# Patient Record
Sex: Male | Born: 1990 | Race: Black or African American | Hispanic: No | Marital: Single | Smoking: Never smoker
Health system: Southern US, Community
[De-identification: ages and names within clinical notes are randomized; demographics above are authoritative.]

## PROBLEM LIST (undated history)

## (undated) DIAGNOSIS — Q240 Dextrocardia: Secondary | ICD-10-CM

## (undated) DIAGNOSIS — Q248 Other specified congenital malformations of heart: Secondary | ICD-10-CM

## (undated) DIAGNOSIS — I10 Essential (primary) hypertension: Secondary | ICD-10-CM

## (undated) DIAGNOSIS — E119 Type 2 diabetes mellitus without complications: Secondary | ICD-10-CM

## (undated) HISTORY — DX: Type 2 diabetes mellitus without complications: E11.9

## (undated) HISTORY — DX: Essential (primary) hypertension: I10

## (undated) HISTORY — PX: HAND SURGERY: SHX662

---

## 2011-09-06 ENCOUNTER — Encounter (HOSPITAL_COMMUNITY): Payer: Self-pay | Admitting: Oncology

## 2011-09-06 ENCOUNTER — Emergency Department (HOSPITAL_COMMUNITY): Payer: Self-pay

## 2011-09-06 ENCOUNTER — Emergency Department (HOSPITAL_COMMUNITY)
Admission: EM | Admit: 2011-09-06 | Discharge: 2011-09-06 | Disposition: A | Discharge status: Home / Self Care | Payer: Self-pay | Attending: Emergency Medicine | Admitting: Emergency Medicine

## 2011-09-06 DIAGNOSIS — R0602 Shortness of breath: Secondary | ICD-10-CM | POA: Insufficient documentation

## 2011-09-06 DIAGNOSIS — R079 Chest pain, unspecified: Secondary | ICD-10-CM | POA: Insufficient documentation

## 2011-09-06 DIAGNOSIS — R209 Unspecified disturbances of skin sensation: Secondary | ICD-10-CM | POA: Insufficient documentation

## 2011-09-06 DIAGNOSIS — R200 Anesthesia of skin: Secondary | ICD-10-CM

## 2011-09-06 HISTORY — DX: Other specified congenital malformations of heart: Q24.8

## 2011-09-06 HISTORY — DX: Dextrocardia: Q24.0

## 2011-09-06 LAB — CBC
HCT: 46.9 % (ref 39.0–52.0)
Hemoglobin: 15.9 g/dL (ref 13.0–17.0)
MCH: 29.4 pg (ref 26.0–34.0)
MCHC: 33.9 g/dL (ref 30.0–36.0)

## 2011-09-06 LAB — BASIC METABOLIC PANEL
BUN: 9 mg/dL (ref 6–23)
Chloride: 104 mEq/L (ref 96–112)
Creatinine, Ser: 1.31 mg/dL (ref 0.50–1.35)
GFR calc Af Amer: 89 mL/min — ABNORMAL LOW (ref >90)
Glucose, Bld: 98 mg/dL (ref 70–99)

## 2011-09-06 LAB — DIFFERENTIAL
Basophils Relative: 0 % (ref 0–1)
Eosinophils Absolute: 0.3 10*3/uL (ref 0.0–0.7)
Lymphs Abs: 3.7 10*3/uL (ref 0.7–4.0)
Monocytes Absolute: 0.9 10*3/uL (ref 0.1–1.0)
Monocytes Relative: 13 % — ABNORMAL HIGH (ref 3–12)

## 2011-09-06 NOTE — ED Provider Notes (Signed)
History   This chart was scribed for Donnetta Hutching, MD by Brooks Sailors. The patient was seen in room APA10/APA10. Patient's care was started at 1013.   CSN: [id number]  Arrival date & time 09/06/11  1013   First MD Initiated Contact with Patient 09/06/11 1045      Chief Complaint  Patient presents with  . Chest Pain    (Consider location/radiation/quality/duration/timing/severity/associated sxs/prior treatment) HPI  Andrew Ritter is a 21 y.o. male who presents to the Emergency Department complaining of moderate chest pain onset yesterday and persistent since with associate left side body numbness. Patient was playing basketball when chest pain started. Wife of patient notes he had confusion and weakness during episode of pain, but says patient currently looks completely normal. Patient notes a similar previous discomfort when running. Denies ever having a CT of the head. Father with h/o MI at age 59.    Past Medical History  Diagnosis Date  . Right sided heart     History reviewed. No pertinent past surgical history.  No family history on file.  History  Substance Use Topics  . Smoking status: Never Smoker   . Smokeless tobacco: Not on file  . Alcohol Use: No      Review of Systems  All other systems reviewed and are negative.    Allergies  Review of patient's allergies indicates no known allergies.  Home Medications  No current outpatient prescriptions on file.  BP 116/65  Pulse 84  Temp(Src) 97.8 F (36.6 C) (Oral)  Resp 16  Ht 5\' 6"  (1.676 m)  Wt 239 lb (108.41 kg)  BMI 38.58 kg/m2  SpO2 100%  Physical Exam  Nursing note and vitals reviewed. Constitutional: He is oriented to person, place, and time. He appears well-developed and well-nourished.  HENT:  Head: Normocephalic and atraumatic.  Eyes: Conjunctivae and EOM are normal. Pupils are equal, round, and reactive to light.  Neck: Normal range of motion. Neck supple.  Cardiovascular: Normal rate,  regular rhythm and normal heart sounds.  Exam reveals no gallop and no friction rub.   No murmur heard. Pulmonary/Chest: Effort normal and breath sounds normal. No respiratory distress. He has no wheezes. He has no rales.  Abdominal: Soft. Bowel sounds are normal.  Musculoskeletal: Normal range of motion.  Neurological: He is alert and oriented to person, place, and time.  Skin: Skin is warm and dry.  Psychiatric: He has a normal mood and affect.    ED Course  Procedures (including critical care time) DIAGNOSTIC STUDIES: Oxygen Saturation is 100% on room air, normal by my interpretation.    COORDINATION OF CARE: 10:55AM EKG blood work, chest x-ray ordered.    Labs Reviewed  DIFFERENTIAL - Abnormal; Notable for the following:    Neutrophils Relative 35 (*)    Lymphocytes Relative 49 (*)    Monocytes Relative 13 (*)    All other components within normal limits  BASIC METABOLIC PANEL - Abnormal; Notable for the following:    GFR calc non Af Amer 77 (*)    GFR calc Af Amer 89 (*)    All other components within normal limits  CBC  TROPONIN I   Ct Head Wo Contrast  09/06/2011  *RADIOLOGY REPORT*  Clinical Data: Left-sided weakness.  Shortness of breath.  Chest pain.  CT HEAD WITHOUT CONTRAST  Technique:  Contiguous axial images were obtained from the base of the skull through the vertex without contrast.  Comparison: None.  Findings: The brain has a  normal appearance without evidence of malformation, atrophy, old or acute infarction, mass lesion, hemorrhage, hydrocephalus or extra-axial collection.  There may be a small insignificant arachnoid cyst in the right frontal region, not of any clinical relevance.  The calvarium is unremarkable. Sinuses, middle ears and mastoids are clear except for a retention cyst or mucocele in the right maxillary ethmoid junction region.  IMPRESSION: Normal appearance of the brain itself.  No acute or significant finding.  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  Original Report Authenticated By: Thomasenia Sales, M.D.     No diagnosis found.   Date: 09/06/2011  Rate: 94  Rhythm: normal sinus rhythm  QRS Axis: right  Intervals: QT prolonged  ST/T Wave abnormalities: normal  Conduction Disutrbances:none  Narrative Interpretation:   Old EKG Reviewed: none available   MDM  Patient low risk for ACS, pulmonary embolus, stroke..  Normal physical exam in ED. Screening tests negative.       I personally performed the services described in this documentation, which was scribed in my presence. The recorded information has been reviewed and considered.  Donnetta Hutching, MD 09/06/11 [phone]

## 2011-09-06 NOTE — Discharge Instructions (Signed)
Tests were normal.  Need to get a primary care Dr.  Avoid vigorous exercise until cleared by primary physician

## 2011-09-06 NOTE — ED Notes (Signed)
Patient returned from xray at this time.

## 2011-09-06 NOTE — ED Notes (Signed)
MD at bedside. 

## 2011-09-06 NOTE — ED Notes (Signed)
Andrew Ritter states a near syncopal episode x yesterday - reports cp, left arm pain, generalized numbness.

## 2013-04-02 ENCOUNTER — Encounter (HOSPITAL_COMMUNITY): Payer: Self-pay | Admitting: Emergency Medicine

## 2013-04-02 ENCOUNTER — Emergency Department (HOSPITAL_COMMUNITY)
Admission: EM | Admit: 2013-04-02 | Discharge: 2013-04-02 | Disposition: A | Discharge status: Home / Self Care | Payer: Self-pay | Attending: Emergency Medicine | Admitting: Emergency Medicine

## 2013-04-02 ENCOUNTER — Emergency Department (HOSPITAL_COMMUNITY): Payer: Self-pay

## 2013-04-02 DIAGNOSIS — J209 Acute bronchitis, unspecified: Secondary | ICD-10-CM | POA: Insufficient documentation

## 2013-04-02 DIAGNOSIS — J40 Bronchitis, not specified as acute or chronic: Secondary | ICD-10-CM

## 2013-04-02 DIAGNOSIS — Z8679 Personal history of other diseases of the circulatory system: Secondary | ICD-10-CM | POA: Insufficient documentation

## 2013-04-02 MED ORDER — AZITHROMYCIN 250 MG PO TABS
500.0000 mg | ORAL_TABLET | Freq: Once | ORAL | Status: AC
  Administered 2013-04-02: 500 mg via ORAL
  Filled 2013-04-02: qty 2

## 2013-04-02 MED ORDER — GUAIFENESIN-CODEINE 100-10 MG/5ML PO SYRP: 10.0000 mL | ORAL_SOLUTION | Freq: Three times a day (TID) | ORAL | Status: DC | PRN

## 2013-04-02 MED ORDER — AZITHROMYCIN 250 MG PO TABS: ORAL_TABLET | ORAL | Status: DC

## 2013-04-02 MED ORDER — ALBUTEROL SULFATE HFA 108 (90 BASE) MCG/ACT IN AERS
2.0000 | INHALATION_SPRAY | Freq: Once | RESPIRATORY_TRACT | Status: AC
  Administered 2013-04-02: 2 via RESPIRATORY_TRACT
  Filled 2013-04-02: qty 6.7

## 2013-04-02 NOTE — ED Notes (Signed)
Cough, yellow sputum with streaks of blood.  Chest hurts with coughing.

## 2013-04-04 NOTE — ED Provider Notes (Signed)
CSN: [id number]     Arrival date & time 04/02/13  5621 History   First MD Initiated Contact with Patient 04/02/13 2055     Chief Complaint  Patient presents with  . Cough   (Consider location/radiation/quality/duration/timing/severity/associated sxs/prior Treatment) Patient is a 22 y.o. male presenting with cough. The history is provided by the patient.  Cough Cough characteristics:  Productive Sputum characteristics:  Yellow (blood tinged) Severity:  Moderate Onset quality:  Gradual Duration:  2 days Timing:  Intermittent Progression:  Unchanged Chronicity:  New Smoker: no   Context: upper respiratory infection   Context: not sick contacts, not smoke exposure and not with activity   Relieved by:  Nothing Worsened by:  Nothing tried Ineffective treatments: benadryl. Associated symptoms: rhinorrhea   Associated symptoms: no chest pain, no chills, no diaphoresis, no ear pain, no fever, no headaches, no myalgias, no rash, no shortness of breath, no sinus congestion, no sore throat and no wheezing   Associated symptoms comment:  Chest tightness with coughing.     Past Medical History  Diagnosis Date  . Right sided heart    History reviewed. No pertinent past surgical history. History reviewed. No pertinent family history. History  Substance Use Topics  . Smoking status: Never Smoker   . Smokeless tobacco: Not on file  . Alcohol Use: No    Review of Systems  Constitutional: Negative for fever, chills, diaphoresis, activity change and appetite change.  HENT: Positive for congestion and rhinorrhea. Negative for ear pain, facial swelling, sore throat and trouble swallowing.   Eyes: Negative for visual disturbance.  Respiratory: Positive for cough and chest tightness. Negative for shortness of breath, wheezing and stridor.   Cardiovascular: Negative for chest pain and palpitations.  Gastrointestinal: Negative for nausea, vomiting and abdominal pain.  Musculoskeletal: Negative  for myalgias, neck pain and neck stiffness.  Skin: Negative.  Negative for rash.  Neurological: Negative for dizziness, weakness, numbness and headaches.  Hematological: Negative for adenopathy.  Psychiatric/Behavioral: Negative for confusion.  All other systems reviewed and are negative.    Allergies  Review of patient's allergies indicates no known allergies.  Home Medications   Current Outpatient Rx  Name  Route  Sig  Dispense  Refill  . diphenhydrAMINE (BENADRYL) 25 MG tablet   Oral   Take 25-50 mg by mouth every 6 (six) hours as needed for allergies or sleep.         Marland Kitchen azithromycin (ZITHROMAX Z-PAK) 250 MG tablet      Take two tablets on day one, then one tab qd days 2-5   6 tablet   0   . guaiFENesin-codeine (ROBITUSSIN AC) 100-10 MG/5ML syrup   Oral   Take 10 mLs by mouth 3 (three) times daily as needed for cough.   120 mL   0    BP 125/70  Pulse 105  Temp(Src) 98.7 F (37.1 C) (Oral)  Resp 18  Ht 5\' 6"  (1.676 m)  Wt 255 lb (115.667 kg)  BMI 41.18 kg/m2  SpO2 98% Physical Exam  Nursing note and vitals reviewed. Constitutional: He is oriented to person, place, and time. He appears well-developed and well-nourished. No distress.  HENT:  Head: Normocephalic and atraumatic.  Right Ear: Tympanic membrane and ear canal normal.  Left Ear: Tympanic membrane and ear canal normal.  Nose: Mucosal edema present.  Mouth/Throat: Uvula is midline, oropharynx is clear and moist and mucous membranes are normal. No oropharyngeal exudate.  Eyes: EOM are normal. Pupils are equal, round,  and reactive to light.  Neck: Normal range of motion, full passive range of motion without pain and phonation normal. Neck supple.  Cardiovascular: Normal rate, regular rhythm, normal heart sounds and intact distal pulses.   No murmur heard. Pulmonary/Chest: Effort normal. No stridor. No respiratory distress. He has no rales. He exhibits no tenderness.  Coarse lungs sounds bilaterally.  No  rales or wheezing  Abdominal: He exhibits no distension. There is no tenderness. There is no rebound.  Musculoskeletal: Normal range of motion. He exhibits no edema.  Lymphadenopathy:    He has no cervical adenopathy.  Neurological: He is alert and oriented to person, place, and time. He exhibits normal muscle tone. Coordination normal.  Skin: Skin is warm and dry.    ED Course  Procedures (including critical care time) Labs Review Labs Reviewed - No data to display Imaging Review Dg Chest 2 View  04/02/2013   CLINICAL DATA:  Cough for the past 2 weeks. Intermittent hemoptysis. Situs inversus.  EXAM: CHEST  2 VIEW  COMPARISON:  Report dated 09/05/2004.  FINDINGS: Again demonstrated is dextro cardia. It is difficult to determine if the aortic arch is on the right or the left. Based on the position of the trachea, it is most likely on the right. The heart is normal in size. Clear lungs with normal vascularity. Gastric air bubble on the right. Unremarkable bones.  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   Electronically Signed   By: Gordan Payment M.D.   On: 04/02/2013 21:03    EKG Interpretation   None       MDM   1. Bronchitis    VSS.  No concerning sx's for PE.  Likely bronchitis.  Pt agrees to symptomatic tx with anti-tussive and z-pack.  He appears stable for discharge.    Sailor Haughn L. Trisha Mangle, PA-C 04/04/13 1312

## 2013-04-05 NOTE — ED Provider Notes (Signed)
Medical screening examination/treatment/procedure(s) were performed by non-physician practitioner and as supervising physician I was immediately available for consultation/collaboration.  EKG Interpretation   None       Mackson Botz, MD, FACEP   Ashlon Lottman L Bayler Nehring, MD 04/05/13 1504 

## 2014-05-28 ENCOUNTER — Emergency Department (HOSPITAL_COMMUNITY): Payer: Self-pay

## 2014-05-28 ENCOUNTER — Emergency Department (HOSPITAL_COMMUNITY)
Admission: EM | Admit: 2014-05-28 | Discharge: 2014-05-28 | Disposition: A | Discharge status: Home / Self Care | Payer: Self-pay | Attending: Emergency Medicine | Admitting: Emergency Medicine

## 2014-05-28 ENCOUNTER — Encounter (HOSPITAL_COMMUNITY): Payer: Self-pay

## 2014-05-28 DIAGNOSIS — R29898 Other symptoms and signs involving the musculoskeletal system: Secondary | ICD-10-CM

## 2014-05-28 DIAGNOSIS — Q240 Dextrocardia: Secondary | ICD-10-CM | POA: Insufficient documentation

## 2014-05-28 DIAGNOSIS — M6281 Muscle weakness (generalized): Secondary | ICD-10-CM | POA: Insufficient documentation

## 2014-05-28 DIAGNOSIS — R0600 Dyspnea, unspecified: Secondary | ICD-10-CM | POA: Insufficient documentation

## 2014-05-28 LAB — BASIC METABOLIC PANEL
ANION GAP: 6 (ref 5–15)
BUN: 13 mg/dL (ref 6–23)
CHLORIDE: 108 meq/L (ref 96–112)
CO2: 25 mmol/L (ref 19–32)
Calcium: 9 mg/dL (ref 8.4–10.5)
Creatinine, Ser: 1.4 mg/dL — ABNORMAL HIGH (ref 0.50–1.35)
GFR, EST AFRICAN AMERICAN: 80 mL/min — AB (ref >90)
GFR, EST NON AFRICAN AMERICAN: 69 mL/min — AB (ref >90)
Glucose, Bld: 99 mg/dL (ref 70–99)
POTASSIUM: 4.1 mmol/L (ref 3.5–5.1)
SODIUM: 139 mmol/L (ref 135–145)

## 2014-05-28 LAB — CBC WITH DIFFERENTIAL/PLATELET
BASOS PCT: 0 % (ref 0–1)
Basophils Absolute: 0 10*3/uL (ref 0.0–0.1)
EOS ABS: 0.2 10*3/uL (ref 0.0–0.7)
Eosinophils Relative: 2 % (ref 0–5)
HCT: 49.3 % (ref 39.0–52.0)
HEMOGLOBIN: 16.5 g/dL (ref 13.0–17.0)
Lymphocytes Relative: 21 % (ref 12–46)
Lymphs Abs: 1.8 10*3/uL (ref 0.7–4.0)
MCH: 29.2 pg (ref 26.0–34.0)
MCHC: 33.5 g/dL (ref 30.0–36.0)
MCV: 87.3 fL (ref 78.0–100.0)
MONO ABS: 0.7 10*3/uL (ref 0.1–1.0)
MONOS PCT: 8 % (ref 3–12)
Neutro Abs: 5.7 10*3/uL (ref 1.7–7.7)
Neutrophils Relative %: 69 % (ref 43–77)
PLATELETS: 200 10*3/uL (ref 150–400)
RBC: 5.65 MIL/uL (ref 4.22–5.81)
RDW: 14.1 % (ref 11.5–15.5)
WBC: 8.4 10*3/uL (ref 4.0–10.5)

## 2014-05-28 LAB — TROPONIN I: Troponin I: <0.03 ng/mL (ref <0.031)

## 2014-05-28 NOTE — Care Management Note (Signed)
ED/CM noted patient did not have health insurance and/or PCP listed in the computer.  Patient was given the [place] resource handout with information on the clinics, food pantries, and the handout for new health insurance sign-up. Pt was also given a Rx discount card. Patient expressed appreciation for information received. 

## 2014-05-28 NOTE — ED Provider Notes (Signed)
CSN: [id number]     Arrival date & time 05/28/14  0714 History  This chart was scribed for Donnetta HutchingBrian Zakhari Fogel, MD by Tonye RoyaltyJoshua Chen, ED Scribe. This patient was seen in room APA06/APA06 and the patient's care was started at 7:43 AM.    Chief Complaint  Patient presents with  . Shortness of Breath   The history is provided by the patient. No language interpreter was used.    HPI Comments: Andrew Ritter is a 24 y.o. male who presents to the Emergency Department complaining of SOB upon waking this morning. He states his SOB is resolved at this time and he feels that he is breathing at baseline. He also reports that he felt like he was going to pass out while sitting down on the toilet. He reports sharp, right central chest pain with onset a few days ago. He notes both his hands were trembling when he tried to have a drink of water. He denies illness in the past few weeks. He notes that when he runs, his left numb becomes numb. He denies asthma or other significant medical history and states he does not use medications every day. He denies drinking or smoking. He states he does not work.   No PCP, per patient  Past Medical History  Diagnosis Date  . Right sided heart    History reviewed. No pertinent past surgical history. No family history on file. History  Substance Use Topics  . Smoking status: Never Smoker   . Smokeless tobacco: Not on file  . Alcohol Use: No    Review of Systems A complete 10 system review of systems was obtained and all systems are negative except as noted in the HPI and PMH.    Allergies  Review of patient's allergies indicates no known allergies.  Home Medications   Prior to Admission medications   Medication Sig Start Date End Date Taking? Authorizing Provider  azithromycin (ZITHROMAX Z-PAK) 250 MG tablet Take two tablets on day one, then one tab qd days 2-5 Patient not taking: Reported on 05/28/2014 04/02/13   Tammy L. Triplett, PA-C  guaiFENesin-codeine (ROBITUSSIN  AC) 100-10 MG/5ML syrup Take 10 mLs by mouth 3 (three) times daily as needed for cough. Patient not taking: Reported on 05/28/2014 04/02/13   Tammy L. Triplett, PA-C   BP 137/96 mmHg  Pulse 82  Temp(Src) 98.4 F (36.9 C) (Oral)  Resp 22  Ht 5\' 5"  (1.651 m)  Wt 270 lb (122.471 kg)  BMI 44.93 kg/m2  SpO2 99% Physical Exam  Constitutional: He is oriented to person, place, and time. He appears well-developed and well-nourished.  HENT:  Head: Normocephalic and atraumatic.  Eyes: Conjunctivae and EOM are normal. Pupils are equal, round, and reactive to light.  Neck: Normal range of motion. Neck supple.  Cardiovascular: Normal rate and regular rhythm.   Pulmonary/Chest: Effort normal and breath sounds normal.  Abdominal: Soft. Bowel sounds are normal.  Musculoskeletal: Normal range of motion.  Neurological: He is alert and oriented to person, place, and time.  Skin: Skin is warm and dry.  Psychiatric: He has a normal mood and affect. His behavior is normal.  Nursing note and vitals reviewed.   ED Course  Procedures (including critical care time)  DIAGNOSTIC STUDIES: Oxygen Saturation is 100% on room air, normal by my interpretation.    COORDINATION OF CARE: 7:47 AM Discussed treatment plan with patient at beside, including labs and head CT. The patient agrees with the plan and has no further  questions at this time.   Labs Review Labs Reviewed  BASIC METABOLIC PANEL - Abnormal; Notable for the following:    Creatinine, Ser 1.40 (*)    GFR calc non Af Amer 69 (*)    GFR calc Af Amer 80 (*)    All other components within normal limits  CBC WITH DIFFERENTIAL  TROPONIN I    Imaging Review Dg Chest 2 View  05/28/2014   CLINICAL DATA:  Shortness of breath, bilateral arm weakness  EXAM: CHEST  2 VIEW  COMPARISON:  04/02/2013  FINDINGS: Situs inversus again noted. Cardiomediastinal silhouette is stable. No acute infiltrate or pulmonary edema. Bony thorax is stable.  IMPRESSION: No  active disease.  Situs inversus again noted.   Electronically Signed   By: Natasha Mead M.D.   On: 05/28/2014 08:27   Ct Head Wo Contrast  05/28/2014   CLINICAL DATA:  Left arm weakness.  Near syncope.  EXAM: CT HEAD WITHOUT CONTRAST  TECHNIQUE: Contiguous axial images were obtained from the base of the skull through the vertex without intravenous contrast.  COMPARISON:  CT head 09/06/2011  FINDINGS: Ventricle size is normal. Negative for acute or chronic infarction. Negative for hemorrhage or fluid collection. Negative for mass or edema. No shift of the midline structures.  Calvarium is intact.  IMPRESSION: Normal   Electronically Signed   By: Marlan Palau M.D.   On: 05/28/2014 08:34     EKG Interpretation   Date/Time:  Tuesday May 28 2014 07:27:00 EST Ventricular Rate:  83 PR Interval:  172 QRS Duration: 95 QT Interval:  348 QTC Calculation: 409 R Axis:   127 Text Interpretation:  Sinus or ectopic atrial rhythm Right axis deviation  Nonspecific T abnormalities, lateral leads Baseline wander in lead(s) V1  Confirmed by Brynnlie Unterreiner  MD, Kyle Stansell ([id number]) on 05/28/2014 8:14:33 AM      MDM   Final diagnoses:  Dyspnea  Left arm weakness    Patient appears in no acute distress. His low risk for ACS, PE, CVA.   Screening tests including labs, troponin, EKG, CT head, chest x-ray all negative. Acute.  Creatinine slightly elevated. Discussed with patient.  I personally performed the services described in this documentation, which was scribed in my presence. The recorded information has been reviewed and is accurate.    Donnetta Hutching, MD 05/28/14 [phone]

## 2014-05-28 NOTE — ED Notes (Signed)
Water given per request.

## 2014-05-28 NOTE — ED Notes (Signed)
EMS reports pt c/o sob that started around 0600 this morning.  Denies any cough or fever. Reports his daughter has been sick and he was up with her all night.  Denies any pain at this time.  Reports intermittent chest pain over the past few days.

## 2014-05-28 NOTE — Discharge Instructions (Signed)
You have a small elevation of your kidney test. Otherwise test were normal. Return if worse or follow-up at your primary care doctor

## 2014-05-28 NOTE — ED Notes (Signed)
Patient with no complaints at this time. Respirations even and unlabored. Skin warm/dry. Discharge instructions reviewed with patient at this time. Patient given opportunity to voice concerns/ask questions. Patient discharged at this time and left Emergency Department with steady gait.   

## 2016-07-27 ENCOUNTER — Emergency Department (HOSPITAL_COMMUNITY): Payer: Self-pay

## 2016-07-27 ENCOUNTER — Encounter (HOSPITAL_COMMUNITY): Payer: Self-pay | Admitting: *Deleted

## 2016-07-27 ENCOUNTER — Emergency Department (HOSPITAL_COMMUNITY)
Admission: EM | Admit: 2016-07-27 | Discharge: 2016-07-27 | Disposition: A | Discharge status: Home / Self Care | Payer: Self-pay | Attending: Emergency Medicine | Admitting: Emergency Medicine

## 2016-07-27 DIAGNOSIS — Z79899 Other long term (current) drug therapy: Secondary | ICD-10-CM | POA: Insufficient documentation

## 2016-07-27 DIAGNOSIS — J40 Bronchitis, not specified as acute or chronic: Secondary | ICD-10-CM | POA: Insufficient documentation

## 2016-07-27 MED ORDER — PREDNISONE 20 MG PO TABS: 40.0000 mg | ORAL_TABLET | Freq: Every day | ORAL | 0 refills | Status: DC

## 2016-07-27 MED ORDER — ALBUTEROL SULFATE HFA 108 (90 BASE) MCG/ACT IN AERS
2.0000 | INHALATION_SPRAY | Freq: Once | RESPIRATORY_TRACT | Status: AC
  Administered 2016-07-27: 2 via RESPIRATORY_TRACT
  Filled 2016-07-27: qty 6.7

## 2016-07-27 MED ORDER — BENZONATATE 100 MG PO CAPS: 200.0000 mg | ORAL_CAPSULE | Freq: Three times a day (TID) | ORAL | 0 refills | Status: DC | PRN

## 2016-07-27 NOTE — ED Triage Notes (Signed)
Pt comes in with cough, nasal congestion, fevers starting 1 week ago. Pt states his his sputum is green in color and he had bright red blood in it one time. NAD noted.

## 2016-07-27 NOTE — ED Notes (Signed)
Fever, cough-productive of phlegm with blood at times, not able to sleep due to cough, emesis but denies for past few days.  Symptoms for a week per pt.   mucinex yesterday, denies taking anything today

## 2016-07-27 NOTE — ED Provider Notes (Signed)
[facility]-EMERGENCY DEPT Provider Note   CSN: [id number] Arrival date & time: 07/27/16  0919     History   Chief Complaint Chief Complaint  Patient presents with  . flu-like symptoms    HPI Andrew Ritter is a 26 y.o. male.  HPI  Andrew Ritter is a 26 y.o. male who presents to the Emergency Department complaining of nasal congestion, cough and intermittent fever and blood tinged sputum.  Cough is occasionally productive.  He also complains of intermittent, brief episodes of irregular heart beats.  He states this has been a recurrent problem and is not associated with shortness of breath, cheat pain or exertion.    Past Medical History:  Diagnosis Date  . Right sided heart     There are no active problems to display for this patient.   History reviewed. No pertinent surgical history.     Home Medications    Prior to Admission medications   Medication Sig Start Date End Date Taking? Authorizing Provider  guaiFENesin (MUCINEX) 600 MG 12 hr tablet Take 600 mg by mouth 2 (two) times daily.   Yes Historical Provider, MD    Family History No family history on file.  Social History Social History  Substance Use Topics  . Smoking status: Never Smoker  . Smokeless tobacco: Never Used  . Alcohol use No     Allergies   Patient has no known allergies.   Review of Systems Review of Systems  Constitutional: Positive for fever. Negative for activity change, appetite change and chills.  HENT: Positive for congestion and rhinorrhea. Negative for facial swelling, sore throat and trouble swallowing.   Eyes: Negative for visual disturbance.  Respiratory: Positive for cough. Negative for shortness of breath, wheezing and stridor.   Cardiovascular: Positive for palpitations. Negative for chest pain.  Gastrointestinal: Negative for abdominal pain, nausea and vomiting.  Genitourinary: Negative for dysuria.  Musculoskeletal: Negative for arthralgias, myalgias, neck pain and  neck stiffness.  Skin: Negative.  Negative for rash.  Neurological: Negative for dizziness, weakness, numbness and headaches.  Hematological: Negative for adenopathy.  Psychiatric/Behavioral: Negative for confusion.  All other systems reviewed and are negative.    Physical Exam Updated Vital Signs BP 146/93 (BP Location: Right Arm)   Pulse 101   Temp 98.7 F (37.1 C) (Oral)   Resp 20   Ht 5\' 6"  (1.676 m)   Wt 127 kg   SpO2 100%   BMI 45.19 kg/m   Physical Exam  Constitutional: He is oriented to person, place, and time. He appears well-developed and well-nourished. No distress.  HENT:  Head: Atraumatic.  Right Ear: Tympanic membrane and ear canal normal.  Left Ear: Tympanic membrane and ear canal normal.  Mouth/Throat: Uvula is midline, oropharynx is clear and moist and mucous membranes are normal. No uvula swelling.  Eyes: Conjunctivae are normal.  Neck: Normal range of motion. Neck supple.  Cardiovascular: Normal rate, regular rhythm and normal heart sounds.   No murmur heard. Pulmonary/Chest: Effort normal. No respiratory distress. He has wheezes.  Few expiratory wheezes bilaterally w/o rales  Abdominal: Soft. He exhibits no distension. There is no tenderness. There is no guarding.  Musculoskeletal: Normal range of motion. He exhibits no edema.  Neurological: He is alert and oriented to person, place, and time. No cranial nerve deficit.  Psychiatric: He has a normal mood and affect.  Nursing note and vitals reviewed.    ED Treatments / Results  Labs (all labs ordered are listed, but only  abnormal results are displayed) Labs Reviewed - No data to display  EKG  EKG Interpretation None      EKG reviewed by Dr. Patria Maneampos  Radiology Dg Chest 2 View  Result Date: 07/27/2016 CLINICAL DATA:  Productive cough, fever. EXAM: CHEST  2 VIEW COMPARISON:  11/15/2014 FINDINGS: Situs inversus again noted. Heart and mediastinal contours are within normal limits. No focal  opacities or effusions. No acute bony abnormality. IMPRESSION: No active cardiopulmonary disease. Electronically Signed   By: Charlett NoseKevin  Dover M.D.   On: 07/27/2016 09:49    Procedures Procedures (including critical care time)  Medications Ordered in ED Medications  albuterol (PROVENTIL HFA;VENTOLIN HFA) 108 (90 Base) MCG/ACT inhaler 2 puff (2 puffs Inhalation Given 07/27/16 1305)     Initial Impression / Assessment and Plan / ED Course  I have reviewed the triage vital signs and the nursing notes.  Pertinent labs & imaging results that were available during my care of the patient were reviewed by me and considered in my medical decision making (see chart for details).      Pt is well appearing.  Vitals stable.  Pt reports likely palpitations, but not heard on exam or captured on EKG.  Pt denies chest pain, discussed need for close PMD f/u and likely holter monitor.    On recheck, lung sounds improved.  Albuterol MDI dispensed.  Rx for tessalon perles and prednisone Pt appears stable for d/c. Return precautions discussed.     Final Clinical Impressions(s) / ED Diagnoses   Final diagnoses:  Bronchitis    New Prescriptions Discharge Medication List as of 07/27/2016 12:39 PM    START taking these medications   Details  benzonatate (TESSALON) 100 MG capsule Take 2 capsules (200 mg total) by mouth 3 (three) times daily as needed for cough. Swallow whole, do not chew, Starting Tue 07/27/2016, Print    predniSONE (DELTASONE) 20 MG tablet Take 2 tablets (40 mg total) by mouth daily. for 5 days, Starting Tue 07/27/2016, Print         Penina Reisner [employer] University[employer]plett, PA-C 07/29/16 2240    Azalia BilisKevin Campos, MD 07/31/16 [phone]

## 2016-07-27 NOTE — Discharge Instructions (Signed)
2 puffs of the inhaler 4 times a day as needed.  Follow-up with your doctor or return here for any worsening symptoms °

## 2016-09-19 ENCOUNTER — Emergency Department (HOSPITAL_COMMUNITY)
Admission: EM | Admit: 2016-09-19 | Discharge: 2016-09-19 | Disposition: A | Discharge status: Home / Self Care | Payer: Self-pay | Attending: Emergency Medicine | Admitting: Emergency Medicine

## 2016-09-19 ENCOUNTER — Encounter (HOSPITAL_COMMUNITY): Payer: Self-pay | Admitting: *Deleted

## 2016-09-19 DIAGNOSIS — R69 Illness, unspecified: Secondary | ICD-10-CM

## 2016-09-19 DIAGNOSIS — R0981 Nasal congestion: Secondary | ICD-10-CM | POA: Insufficient documentation

## 2016-09-19 DIAGNOSIS — R6883 Chills (without fever): Secondary | ICD-10-CM | POA: Insufficient documentation

## 2016-09-19 DIAGNOSIS — J111 Influenza due to unidentified influenza virus with other respiratory manifestations: Secondary | ICD-10-CM

## 2016-09-19 DIAGNOSIS — R05 Cough: Secondary | ICD-10-CM | POA: Insufficient documentation

## 2016-09-19 DIAGNOSIS — Z79899 Other long term (current) drug therapy: Secondary | ICD-10-CM | POA: Insufficient documentation

## 2016-09-19 DIAGNOSIS — R630 Anorexia: Secondary | ICD-10-CM | POA: Insufficient documentation

## 2016-09-19 DIAGNOSIS — R109 Unspecified abdominal pain: Secondary | ICD-10-CM | POA: Insufficient documentation

## 2016-09-19 DIAGNOSIS — R197 Diarrhea, unspecified: Secondary | ICD-10-CM | POA: Insufficient documentation

## 2016-09-19 MED ORDER — IBUPROFEN 800 MG PO TABS: 800.0000 mg | ORAL_TABLET | Freq: Three times a day (TID) | ORAL | 0 refills | Status: DC

## 2016-09-19 MED ORDER — OSELTAMIVIR PHOSPHATE 75 MG PO CAPS: 75.0000 mg | ORAL_CAPSULE | Freq: Two times a day (BID) | ORAL | 0 refills | Status: DC

## 2016-09-19 NOTE — Discharge Instructions (Signed)
Drink plenty of fluids.  Tylenol every 4 hrs if needed.  You can take Robitussin as directed if needed for cough.  Follow-up with your doctor or return here if needed.

## 2016-09-19 NOTE — ED Provider Notes (Signed)
[facility]-EMERGENCY DEPT Provider Note   CSN: [id number] Arrival date & time: 09/19/16  1811  By signing my name below, I, Diona Browner, attest that this documentation has been prepared under the direction and in the presence of Sneijder Bernards PA-C. Electronically Signed: Diona Browner, ED Scribe. 09/19/16. 6:46 PM.  History   Chief Complaint Chief Complaint  Patient presents with  . Influenza    HPI Andrew Ritter is a 26 y.o. male who presents to the Emergency Department complaining of a productive cough that started 09/17/16. He is coughing up green sputum. Associated sx include congestion, chills, diarrhea, abdominal cramping, fever at home of 101 today, loss of appetite, and generalized body aches. Patient states his son was diagnosed on 09/16/16 with influenza by his PCP. Pt has taken mucinex with mild relief. He has not tried taking tylenol, motrin or imodium. He denies nausea and vomiting, dysuria, chest pain and shortness of breath.  The history is provided by the patient. No language interpreter was used.    Past Medical History:  Diagnosis Date  . Right sided heart     There are no active problems to display for this patient.   History reviewed. No pertinent surgical history.     Home Medications    Prior to Admission medications   Medication Sig Start Date End Date Taking? Authorizing Provider  benzonatate (TESSALON) 100 MG capsule Take 2 capsules (200 mg total) by mouth 3 (three) times daily as needed for cough. Swallow whole, do not chew 07/27/16   Iveliz Garay, PA-C  guaiFENesin (MUCINEX) 600 MG 12 hr tablet Take 600 mg by mouth 2 (two) times daily.    Historical Provider, MD  predniSONE (DELTASONE) 20 MG tablet Take 2 tablets (40 mg total) by mouth daily. for 5 days 07/27/16   Pauline Aus, PA-C    Family History No family history on file.  Social History Social History  Substance Use Topics  . Smoking status: Never Smoker  . Smokeless tobacco: Never  Used  . Alcohol use No     Allergies   Patient has no known allergies.   Review of Systems Review of Systems  Constitutional: Positive for appetite change, chills and fever.  HENT: Positive for congestion.   Respiratory: Positive for cough. Negative for chest tightness, shortness of breath and wheezing.   Cardiovascular: Negative for chest pain.  Gastrointestinal: Positive for diarrhea. Negative for abdominal pain, nausea and vomiting.  Genitourinary: Negative for difficulty urinating and dysuria.  Musculoskeletal: Positive for myalgias. Negative for arthralgias, neck pain and neck stiffness.  Skin: Negative for rash.  Neurological: Negative for dizziness, seizures, syncope and numbness.     Physical Exam Updated Vital Signs BP 139/82 (BP Location: Left Arm)   Pulse (!) 106   Temp 99.9 F (37.7 C) (Oral)   Resp 18   Ht  (1.676 m)   Wt 280 lb (127 kg)   SpO2 99%   BMI 45.19 kg/m   Physical Exam  Constitutional: He appears well-developed and well-nourished. No distress.  HENT:  Head: Normocephalic and atraumatic.  Mild erythema of the oropharynx.  No edema.  Uvula is midline.   Eyes: Conjunctivae and EOM are normal. Pupils are equal, round, and reactive to light.  Neck: Normal range of motion.  Cardiovascular: Normal rate, regular rhythm and normal heart sounds.   Pulmonary/Chest: Effort normal and breath sounds normal. No respiratory distress. He has no wheezes. He has no rales.  Abdominal: Soft. He exhibits no distension  and no mass. There is no tenderness. There is no guarding.  Musculoskeletal: Normal range of motion.  Lymphadenopathy:    He has no cervical adenopathy.  Neurological: He is alert. No sensory deficit.  Skin: Skin is warm. No rash noted. No pallor.  Psychiatric: He has a normal mood and affect. His behavior is normal.  Nursing note and vitals reviewed.    ED Treatments / Results  DIAGNOSTIC STUDIES: Oxygen Saturation is 99% on RA,  normal by my interpretation.  COORDINATION OF CARE: 6:39 PM-Discussed next steps with pt. Pt verbalized understanding and is agreeable with the plan.   Labs (all labs ordered are listed, but only abnormal results are displayed) Labs Reviewed - No data to display  EKG  EKG Interpretation None       Radiology No results found.  Procedures Procedures (including critical care time)  Medications Ordered in ED Medications - No data to display   Initial Impression / Assessment and Plan / ED Course  I have reviewed the triage vital signs and the nursing notes.  Pertinent labs & imaging results that were available during my care of the patient were reviewed by me and considered in my medical decision making (see chart for details).     Pt with family member recently diagnosed with influenza.  Vitals stable.  Non-toxic appearing.  Will tx with tamiflu and ibuprofen.  Return precautions discussed.   Final Clinical Impressions(s) / ED Diagnoses   Final diagnoses:  Influenza-like illness    New Prescriptions New Prescriptions   No medications on file  I personally performed the services described in this documentation, which was scribed in my presence. The recorded information has been reviewed and is accurate.     Pauline Aus, PA-C 09/20/16 0040    Raeford Razor, MD 09/22/16 1007

## 2016-09-19 NOTE — ED Notes (Signed)
Pt alert and oriented x 4. Stable gait. Pt given discharge papers/prescriptions. Pt told to stop by registration to complete any additional paperwork. Pt left the department with no further questions. 

## 2016-09-19 NOTE — ED Triage Notes (Signed)
Pts son was sick with the flu this week. He now has symptoms of cough, congestion, and body aches.

## 2019-01-10 ENCOUNTER — Other Ambulatory Visit: Payer: Self-pay

## 2019-01-10 ENCOUNTER — Encounter (HOSPITAL_COMMUNITY): Payer: Self-pay | Admitting: Emergency Medicine

## 2019-01-10 ENCOUNTER — Emergency Department (HOSPITAL_COMMUNITY)
Admission: EM | Admit: 2019-01-10 | Discharge: 2019-01-10 | Disposition: A | Discharge status: Home / Self Care | Payer: Self-pay | Attending: Emergency Medicine | Admitting: Emergency Medicine

## 2019-01-10 DIAGNOSIS — Z8739 Personal history of other diseases of the musculoskeletal system and connective tissue: Secondary | ICD-10-CM | POA: Insufficient documentation

## 2019-01-10 DIAGNOSIS — Z79899 Other long term (current) drug therapy: Secondary | ICD-10-CM | POA: Insufficient documentation

## 2019-01-10 DIAGNOSIS — Z791 Long term (current) use of non-steroidal anti-inflammatories (NSAID): Secondary | ICD-10-CM | POA: Insufficient documentation

## 2019-01-10 DIAGNOSIS — M25572 Pain in left ankle and joints of left foot: Secondary | ICD-10-CM | POA: Insufficient documentation

## 2019-01-10 MED ORDER — DEXAMETHASONE 4 MG PO TABS: 4.0000 mg | ORAL_TABLET | Freq: Two times a day (BID) | ORAL | 0 refills | Status: DC

## 2019-01-10 MED ORDER — HYDROCODONE-ACETAMINOPHEN 5-325 MG PO TABS: 1.0000 | ORAL_TABLET | ORAL | 0 refills | Status: DC | PRN

## 2019-01-10 MED ORDER — COLCHICINE 0.6 MG PO TABS: 0.6000 mg | ORAL_TABLET | Freq: Every day | ORAL | 0 refills | Status: DC

## 2019-01-10 NOTE — ED Triage Notes (Signed)
Patient states he woke up today with left ankle pain, no known injury. Patient has history of gout.

## 2019-01-10 NOTE — Discharge Instructions (Signed)
Your examination suggest probable gout attack.  It is also of note that you have been increasing the amount of time standing and walking and lifting heavy objects.  Please use colchicine 1 tablet daily with food.  Please use 600 mg of ibuprofen with breakfast, lunch, dinner, and at bedtime.  Please use Decadron 2 times daily with food.  May use Norco for more severe pain.This medication may cause drowsiness. Please do not drink, drive, or participate in activity that requires concentration while taking this medication.  Please see Dr. Aline Brochure for orthopedic evaluation if not improving.

## 2019-01-10 NOTE — ED Provider Notes (Addendum)
[facility] EMERGENCY DEPARTMENT Provider Note   CSN: [id number] Arrival date & time: 01/10/19  1347     History   Chief Complaint Chief Complaint  Patient presents with  . Ankle Pain    HPI Andrew Ritter is a 28 y.o. male.     Patient is a 28 year old male who presents to the emergency department with a complaint of left ankle pain.  The patient states that he awakened this morning with pain of the ankle.  He says he could not put weight on the left ankle.  He says that even the covers or light touch really causes a great deal of pain.  He thought that he was not going to be able to get into his truck because of trying to put weight on the left lower extremity.  He does not recall any injury or to the left lower extremity.  It is of note that he is started a new job where he is standing and walking for 12 hours.  He says he has to do a lot of heavy lifting throughout the shift.  It is also of note that the patient has a history of gout arthritis.  The patient presents to the emergency department for assistance with this issue.  The history is provided by the patient.    Past Medical History:  Diagnosis Date  . Right sided heart     There are no active problems to display for this patient.   History reviewed. No pertinent surgical history.      Home Medications    Prior to Admission medications   Medication Sig Start Date End Date Taking? Authorizing Provider  benzonatate (TESSALON) 100 MG capsule Take 2 capsules (200 mg total) by mouth 3 (three) times daily as needed for cough. Swallow whole, do not chew 07/27/16   Triplett, Tammy, PA-C  guaiFENesin (MUCINEX) 600 MG 12 hr tablet Take 600 mg by mouth 2 (two) times daily.    [provider]  ibuprofen (ADVIL,MOTRIN) 800 MG tablet Take 1 tablet (800 mg total) by mouth 3 (three) times daily. 09/19/16   Triplett, Tammy, PA-C  oseltamivir (TAMIFLU) 75 MG capsule Take 1 capsule (75 mg total) by mouth every 12  (twelve) hours. 09/19/16   Triplett, Tammy, PA-C  predniSONE (DELTASONE) 20 MG tablet Take 2 tablets (40 mg total) by mouth daily. for 5 days 07/27/16   Kem Parkinson, PA-C    Family History Family History  Problem Relation Age of Onset  . Hypertension Mother   . Hypertension Father     Social History Social History   Tobacco Use  . Smoking status: Never Smoker  . Smokeless tobacco: Never Used  Substance Use Topics  . Alcohol use: No  . Drug use: Yes    Types: Marijuana    Comment: yesterday     Allergies   Patient has no known allergies.   Review of Systems Review of Systems  Constitutional: Negative for activity change and appetite change.  HENT: Negative for congestion, ear discharge, ear pain, facial swelling, nosebleeds, rhinorrhea, sneezing and tinnitus.   Eyes: Negative for photophobia, pain and discharge.  Respiratory: Negative for cough, choking, shortness of breath and wheezing.   Cardiovascular: Negative for chest pain, palpitations and leg swelling.  Gastrointestinal: Negative for abdominal pain, blood in stool, constipation, diarrhea, nausea and vomiting.  Genitourinary: Negative for difficulty urinating, dysuria, flank pain, frequency and hematuria.  Musculoskeletal: Positive for arthralgias. Negative for back pain, gait problem, myalgias and  neck pain.  Skin: Negative for color change, rash and wound.  Neurological: Negative for dizziness, seizures, syncope, facial asymmetry, speech difficulty, weakness and numbness.  Hematological: Negative for adenopathy. Does not bruise/bleed easily.  Psychiatric/Behavioral: Negative for agitation, confusion, hallucinations, self-injury and suicidal ideas. The patient is not nervous/anxious.      Physical Exam Updated Vital Signs BP (!) 154/116 (BP Location: Right Arm)   Pulse 86   Temp 98.3 F (36.8 C) (Oral)   Resp 16   SpO2 100%   Physical Exam Vitals signs and nursing note reviewed.  Constitutional:       Appearance: He is well-developed. He is not toxic-appearing.  HENT:     Head: Normocephalic.     Right Ear: Tympanic membrane and external ear normal.     Left Ear: Tympanic membrane and external ear normal.  Eyes:     General: Lids are normal.     Pupils: Pupils are equal, round, and reactive to light.  Neck:     Musculoskeletal: Normal range of motion and neck supple.     Vascular: No carotid bruit.  Cardiovascular:     Rate and Rhythm: Normal rate and regular rhythm.     Pulses: Normal pulses.     Heart sounds: Normal heart sounds.  Pulmonary:     Effort: No respiratory distress.     Breath sounds: Normal breath sounds.  Abdominal:     General: Bowel sounds are normal.     Palpations: Abdomen is soft.     Tenderness: There is no abdominal tenderness. There is no guarding.  Musculoskeletal: Normal range of motion.        General: Tenderness present.     Left ankle: Tenderness. Lateral malleolus tenderness found. Achilles tendon normal.       Feet:     Comments: There is good range of motion of the left hip and knee.  The left knee is warm, but not hot.  The left ankle is tender to even light touch mostly on the medial aspect.  There is no red streaking noted.  There is no hot joint.  The ankle is warm to touch however capillary refill is less than 2 seconds.  The dorsalis pedis is 2+.  Lymphadenopathy:     Head:     Right side of head: No submandibular adenopathy.     Left side of head: No submandibular adenopathy.     Cervical: No cervical adenopathy.  Skin:    General: Skin is warm and dry.  Neurological:     Mental Status: He is alert and oriented to person, place, and time.     Cranial Nerves: No cranial nerve deficit.     Sensory: No sensory deficit.  Psychiatric:        Speech: Speech normal.      ED Treatments / Results  Labs (all labs ordered are listed, but only abnormal results are displayed) Labs Reviewed - No data to display  EKG None  Radiology No  results found.  Procedures Procedures (including critical care time)  Medications Ordered in ED Medications - No data to display   Initial Impression / Assessment and Plan / ED Course  I have reviewed the triage vital signs and the nursing notes.  Pertinent labs & imaging results that were available during my care of the patient were reviewed by me and considered in my medical decision making (see chart for details).          Final Clinical  Impressions(s) / ED Diagnoses MDM  Blood pressure is elevated at 154/116, otherwise vital signs within normal limits.  Pulse oximetry is 100% on room air.  Within normal limits by my interpretation.  The left ankle is warm to touch.  But there is no evidence of a septic joint.  The Achilles tendon is intact.  The patient has pain with even light touch and severe pain with movement.  Patient has a history of gout.  Patient will be treated with colchicine, ibuprofen, and Decadron.  Patient is given a prescription for 12 tablets of Norco for severe pain.  The patient will follow-up with Dr. Romeo AppleHarrison for orthopedic evaluation if these efforts are not successful.  Blood pressure at discharge was 161/113.  I have asked the patient to see the local health department or 1 of the local clinics to establish a primary person to help get this under control.  Patient was given information concerning the dangers of uncontrolled blood pressure.  Patient acknowledges understanding of these instructions.    Final diagnoses:  Acute left ankle pain  History of gout    ED Discharge Orders    None       Ivery QualeBryant, Kurt Azimi, PA-C 01/10/19 1600    Ivery QualeBryant, Bernece Gall, PA-C 01/10/19 1614    Vanetta MuldersZackowski, Scott, MD 01/20/19 1152

## 2019-03-14 ENCOUNTER — Emergency Department (HOSPITAL_COMMUNITY)
Admission: EM | Admit: 2019-03-14 | Discharge: 2019-03-14 | Disposition: A | Discharge status: Home / Self Care | Payer: Self-pay | Attending: Emergency Medicine | Admitting: Emergency Medicine

## 2019-03-14 ENCOUNTER — Other Ambulatory Visit: Payer: Self-pay

## 2019-03-14 ENCOUNTER — Emergency Department (HOSPITAL_COMMUNITY): Payer: Self-pay

## 2019-03-14 ENCOUNTER — Encounter (HOSPITAL_COMMUNITY): Payer: Self-pay | Admitting: *Deleted

## 2019-03-14 DIAGNOSIS — Y999 Unspecified external cause status: Secondary | ICD-10-CM | POA: Insufficient documentation

## 2019-03-14 DIAGNOSIS — Y929 Unspecified place or not applicable: Secondary | ICD-10-CM | POA: Insufficient documentation

## 2019-03-14 DIAGNOSIS — Z79899 Other long term (current) drug therapy: Secondary | ICD-10-CM | POA: Insufficient documentation

## 2019-03-14 DIAGNOSIS — S60221A Contusion of right hand, initial encounter: Secondary | ICD-10-CM | POA: Insufficient documentation

## 2019-03-14 DIAGNOSIS — Y9383 Activity, rough housing and horseplay: Secondary | ICD-10-CM | POA: Insufficient documentation

## 2019-03-14 DIAGNOSIS — W010XXA Fall on same level from slipping, tripping and stumbling without subsequent striking against object, initial encounter: Secondary | ICD-10-CM | POA: Insufficient documentation

## 2019-03-14 MED ORDER — ONDANSETRON HCL 4 MG PO TABS
4.0000 mg | ORAL_TABLET | Freq: Once | ORAL | Status: AC
  Administered 2019-03-14: 12:00:00 4 mg via ORAL
  Filled 2019-03-14: qty 1

## 2019-03-14 MED ORDER — KETOROLAC TROMETHAMINE 10 MG PO TABS
10.0000 mg | ORAL_TABLET | Freq: Once | ORAL | Status: AC
  Administered 2019-03-14: 12:00:00 10 mg via ORAL
  Filled 2019-03-14: qty 1

## 2019-03-14 MED ORDER — TRAMADOL HCL 50 MG PO TABS
100.0000 mg | ORAL_TABLET | Freq: Once | ORAL | Status: AC
Start: 2019-03-14 — End: 2019-03-14
  Administered 2019-03-14: 12:00:00 100 mg via ORAL
  Filled 2019-03-14: qty 2

## 2019-03-14 MED ORDER — TRAMADOL HCL 50 MG PO TABS: 50.0000 mg | ORAL_TABLET | Freq: Four times a day (QID) | ORAL | 0 refills | Status: DC | PRN

## 2019-03-14 MED ORDER — IBUPROFEN 600 MG PO TABS: 600.0000 mg | ORAL_TABLET | Freq: Four times a day (QID) | ORAL | 0 refills | Status: DC

## 2019-03-14 NOTE — ED Triage Notes (Signed)
Pt c/o right hand pain and swelling after falling on it yesterday. Pt reports hx of having screws placed in his right hand during surgery and is concerned he might have "messed something up".

## 2019-03-14 NOTE — ED Provider Notes (Signed)
[facility] EMERGENCY DEPARTMENT Provider Note   CSN: [id number] Arrival date & time: 03/14/19  1104     History   Chief Complaint Chief Complaint  Patient presents with  . Hand Injury    HPI Andrew Ritter is a 28 y.o. male.     Patient is a 28 year old male who presents to the emergency department with a complaint of right hand pain and swelling.  The patient states that on yesterday October 20, the patient was playing with his young children, he fell on the right hand.  The patient states that in April of this year he had surgery on his hand because of fractures.  He had screws and plates placed in.  He says since that time he has been having pain and swelling of the right hand.  He was concerned that when he fell he may have done something to the plates and screws.  He is particularly concerned because he was told by the surgeon that after a second incident he had an area that was not healing correctly.  The patient requests evaluation concerning his hand.  No other injury reported.  Patient denies being on any anticoagulation medications.  He has no history of bleeding disorders.  The history is provided by the patient.  Hand Injury Associated symptoms: no back pain and no neck pain     Past Medical History:  Diagnosis Date  . Right sided heart     There are no active problems to display for this patient.   Past Surgical History:  Procedure Laterality Date  . HAND SURGERY Right    screws placed        Home Medications    Prior to Admission medications   Medication Sig Start Date End Date Taking? Authorizing Provider  benzonatate (TESSALON) 100 MG capsule Take 2 capsules (200 mg total) by mouth 3 (three) times daily as needed for cough. Swallow whole, do not chew 07/27/16   Triplett, Tammy, PA-C  colchicine 0.6 MG tablet Take 1 tablet (0.6 mg total) by mouth daily. 01/10/19   Ivery Quale, PA-C  dexamethasone (DECADRON) 4 MG tablet Take 1 tablet (4 mg total)  by mouth 2 (two) times daily with a meal. 01/10/19   Ivery Quale, PA-C  guaiFENesin (MUCINEX) 600 MG 12 hr tablet Take 600 mg by mouth 2 (two) times daily.    [provider]  HYDROcodone-acetaminophen (NORCO/VICODIN) 5-325 MG tablet Take 1 tablet by mouth every 4 (four) hours as needed. 01/10/19   Ivery Quale, PA-C  ibuprofen (ADVIL,MOTRIN) 800 MG tablet Take 1 tablet (800 mg total) by mouth 3 (three) times daily. 09/19/16   Triplett, Tammy, PA-C  oseltamivir (TAMIFLU) 75 MG capsule Take 1 capsule (75 mg total) by mouth every 12 (twelve) hours. 09/19/16   Triplett, Tammy, PA-C  predniSONE (DELTASONE) 20 MG tablet Take 2 tablets (40 mg total) by mouth daily. for 5 days 07/27/16   Pauline Aus, PA-C    Family History Family History  Problem Relation Age of Onset  . Hypertension Mother   . Hypertension Father     Social History Social History   Tobacco Use  . Smoking status: Never Smoker  . Smokeless tobacco: Never Used  Substance Use Topics  . Alcohol use: No  . Drug use: Not Currently    Types: Marijuana     Allergies   Shrimp [shellfish allergy] and Other   Review of Systems Review of Systems  Constitutional: Negative for activity change and appetite  change.  HENT: Negative for congestion, ear discharge, ear pain, facial swelling, nosebleeds, rhinorrhea, sneezing and tinnitus.   Eyes: Negative for photophobia, pain and discharge.  Respiratory: Negative for cough, choking, shortness of breath and wheezing.   Cardiovascular: Negative for chest pain, palpitations and leg swelling.  Gastrointestinal: Negative for abdominal pain, blood in stool, constipation, diarrhea, nausea and vomiting.  Genitourinary: Negative for difficulty urinating, dysuria, flank pain, frequency and hematuria.  Musculoskeletal: Positive for arthralgias. Negative for back pain, gait problem, myalgias and neck pain.  Skin: Negative for color change, rash and wound.  Neurological: Negative for  dizziness, seizures, syncope, facial asymmetry, speech difficulty, weakness and numbness.  Hematological: Negative for adenopathy. Does not bruise/bleed easily.  Psychiatric/Behavioral: Negative for agitation, confusion, hallucinations, self-injury and suicidal ideas. The patient is not nervous/anxious.      Physical Exam Updated Vital Signs BP (!) 141/96 (BP Location: Right Arm)   Pulse 90   Temp 98.4 F (36.9 C) (Oral)   Resp 16   Ht 5\' 7"  (1.702 m)   Wt 113.4 kg   BMI 39.16 kg/m   Physical Exam Vitals signs and nursing note reviewed.  Constitutional:      Appearance: He is well-developed. He is not toxic-appearing.  HENT:     Head: Normocephalic.     Right Ear: Tympanic membrane and external ear normal.     Left Ear: Tympanic membrane and external ear normal.  Eyes:     General: Lids are normal.     Pupils: Pupils are equal, round, and reactive to light.  Neck:     Musculoskeletal: Normal range of motion and neck supple.     Vascular: No carotid bruit.  Cardiovascular:     Rate and Rhythm: Normal rate and regular rhythm.     Pulses: Normal pulses.     Heart sounds: Normal heart sounds.  Pulmonary:     Effort: No respiratory distress.     Breath sounds: Normal breath sounds.  Abdominal:     General: Bowel sounds are normal.     Palpations: Abdomen is soft.     Tenderness: There is no abdominal tenderness. There is no guarding.  Musculoskeletal:        General: Tenderness present.     Comments: There is full range of motion of the right shoulder.  There is no deformity or pain of the right clavicle.  There is no deformity or pain of the right humeral area, elbow area, or forearm area.  There is full range of motion of the right wrist.  There is a well-healed surgical scar of the dorsum of the right hand.  There is moderate tenderness over this area with slight raised area present.  Capillary refill is less than 2 seconds the radial pulses 2+.  There is no deformity or  atrophy involving the thenar eminence.  Lymphadenopathy:     Head:     Right side of head: No submandibular adenopathy.     Left side of head: No submandibular adenopathy.     Cervical: No cervical adenopathy.  Skin:    General: Skin is warm and dry.  Neurological:     Mental Status: He is alert and oriented to person, place, and time.     Cranial Nerves: No cranial nerve deficit.     Sensory: No sensory deficit.  Psychiatric:        Speech: Speech normal.      ED Treatments / Results  Labs (all labs ordered are  listed, but only abnormal results are displayed) Labs Reviewed - No data to display  EKG None  Radiology Dg Hand Complete Right  Result Date: 03/14/2019 CLINICAL DATA:  Fall, right hand pain EXAM: RIGHT HAND - COMPLETE 3+ VIEW COMPARISON:  None. FINDINGS: Old healed 5th metacarpal fracture. Plate and screw fixation noted in the 4th metacarpal across healing mid right 4th metacarpal fracture. Fracture line is partially evident. No acute fracture, subluxation or dislocation. IMPRESSION: Plate and screw fixation device across healing partially evident mid right 4th metacarpal fracture. Old healed right 5th metacarpal fracture. No acute findings. Electronically Signed   By: Charlett NoseKevin  Dover M.D.   On: 03/14/2019 11:40    Procedures Procedures (including critical care time)  Medications Ordered in ED Medications - No data to display   Initial Impression / Assessment and Plan / ED Course  I have reviewed the triage vital signs and the nursing notes.  Pertinent labs & imaging results that were available during my care of the patient were reviewed by me and considered in my medical decision making (see chart for details).          Final Clinical Impressions(s) / ED Diagnoses MDM  Blood pressure slightly elevated at 141/96.  The remainder the vital signs are within normal limits.  The patient has pain to palpation and with flexion extension of the fingers of the  right hand in the area where the patient had his surgery.  There are no hot areas appreciated.  No neurovascular deficits appreciated.  X-ray shows no new fracture.  There is partial healing of one of the fractures of the fourth metacarpal.  The hardware remains in place.  I reviewed these findings with the patient in terms of which he understands.  The patient will be referred to Dr. Romeo AppleHarrison for hand evaluation if this is not improving.  Patient will be treated with ibuprofen, and 10 tablets of Ultram.   Final diagnoses:  Contusion of right hand, initial encounter    ED Discharge Orders         Ordered    ibuprofen (ADVIL) 600 MG tablet  4 times daily     03/14/19 1213    traMADol (ULTRAM) 50 MG tablet  Every 6 hours PRN     03/14/19 1213           Ivery QualeBryant, Andray Assefa, PA-C 03/15/19 2116    Jacalyn LefevreHaviland, Julie, MD 03/16/19 [phone]

## 2019-03-14 NOTE — Discharge Instructions (Signed)
Your blood pressure is slightly elevated at 141/96.  Please have this rechecked soon.  The x-ray of your hand is negative for any new fracture.  There is one of the areas of previous surgery that has not completely healed.  Your hardware is all in place. Please see Dr. Aline Brochure for orthopedic evaluation and management if the pain and swelling continue.  Please use ibuprofen with breakfast, lunch, dinner, and at bedtime.  May use Ultram for more severe pain. This medication may cause drowsiness. Please do not drink, drive, or participate in activity that requires concentration while taking this medication.

## 2021-05-24 IMAGING — DX DG HAND COMPLETE 3+V*R*
3 series · 3 of 3 positions shown · non-contrast
Comparison: None.

CLINICAL DATA: Fall, right hand pain

EXAM:
RIGHT HAND - COMPLETE 3+ VIEW

[hand pa]
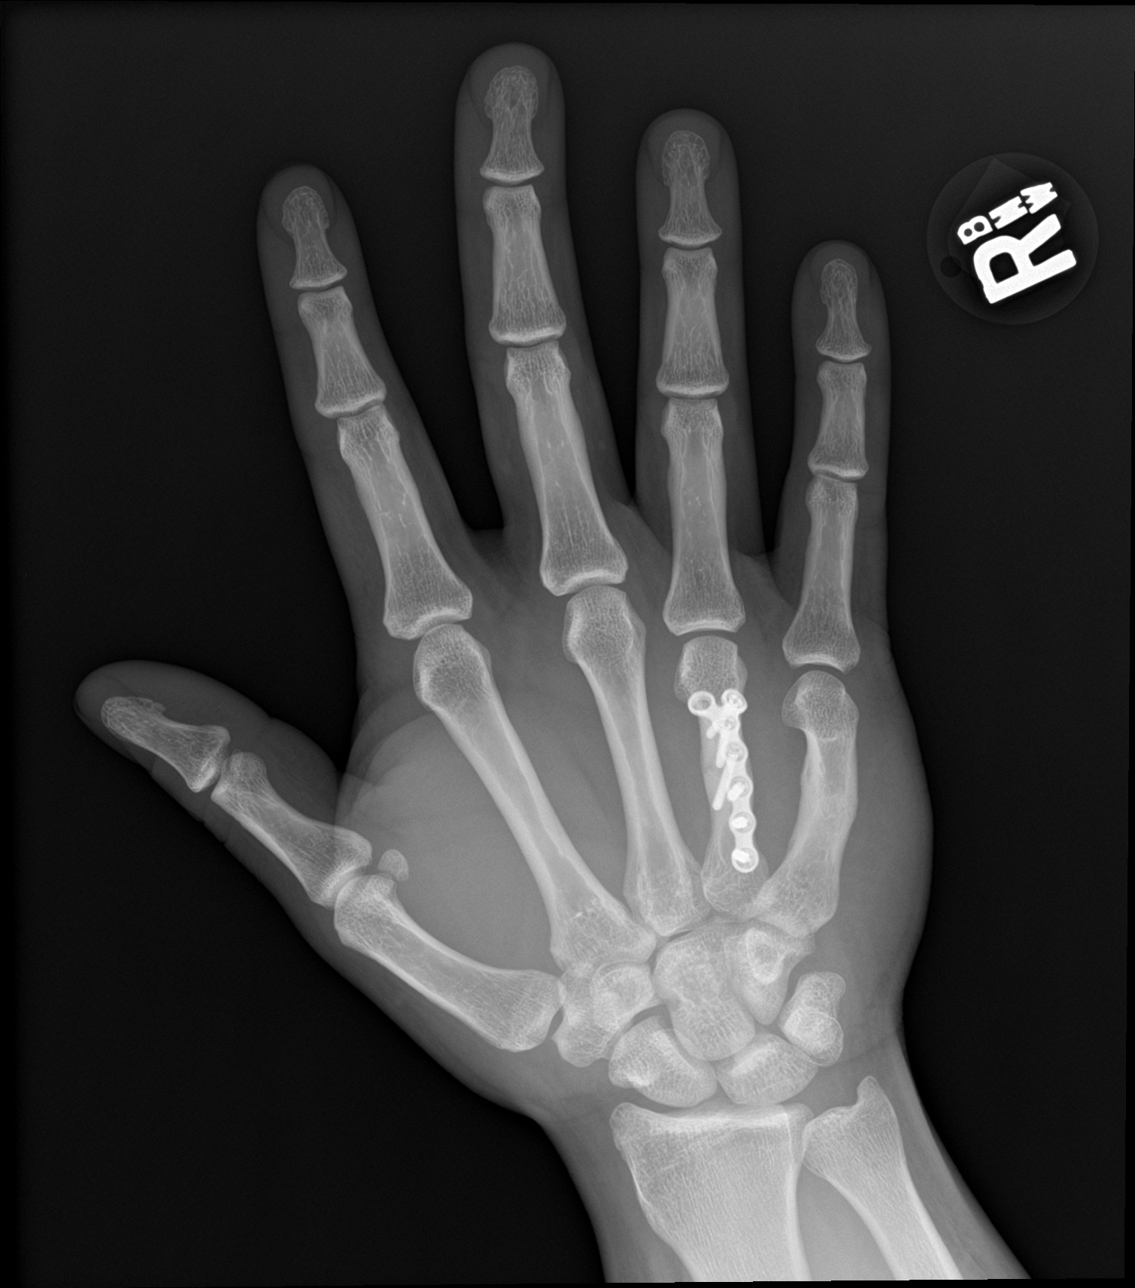

[hand obl]
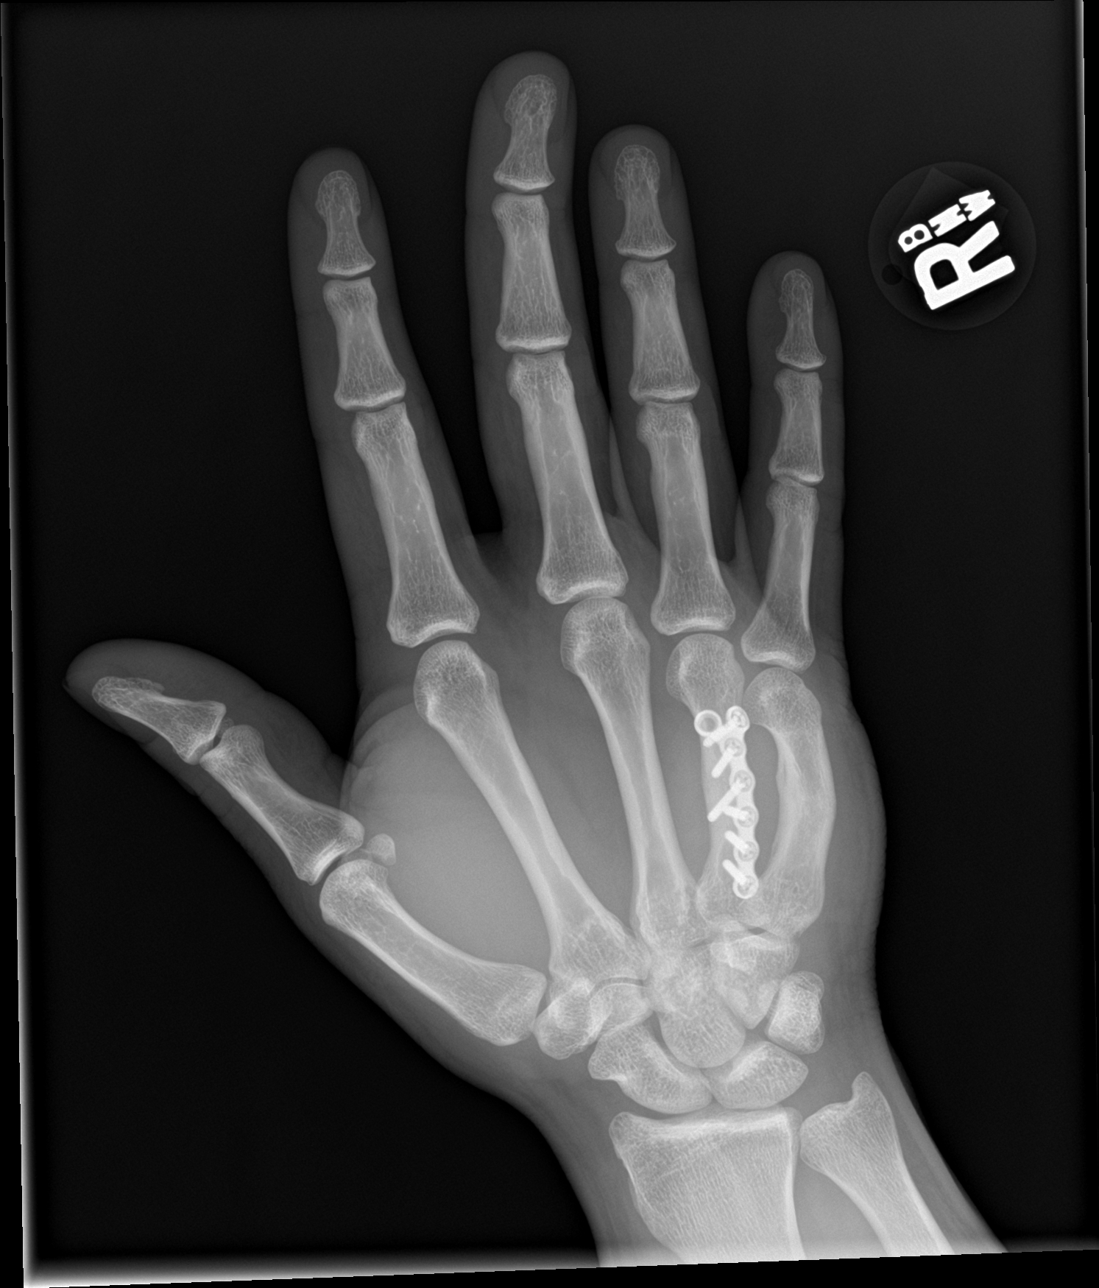

[hand lat]
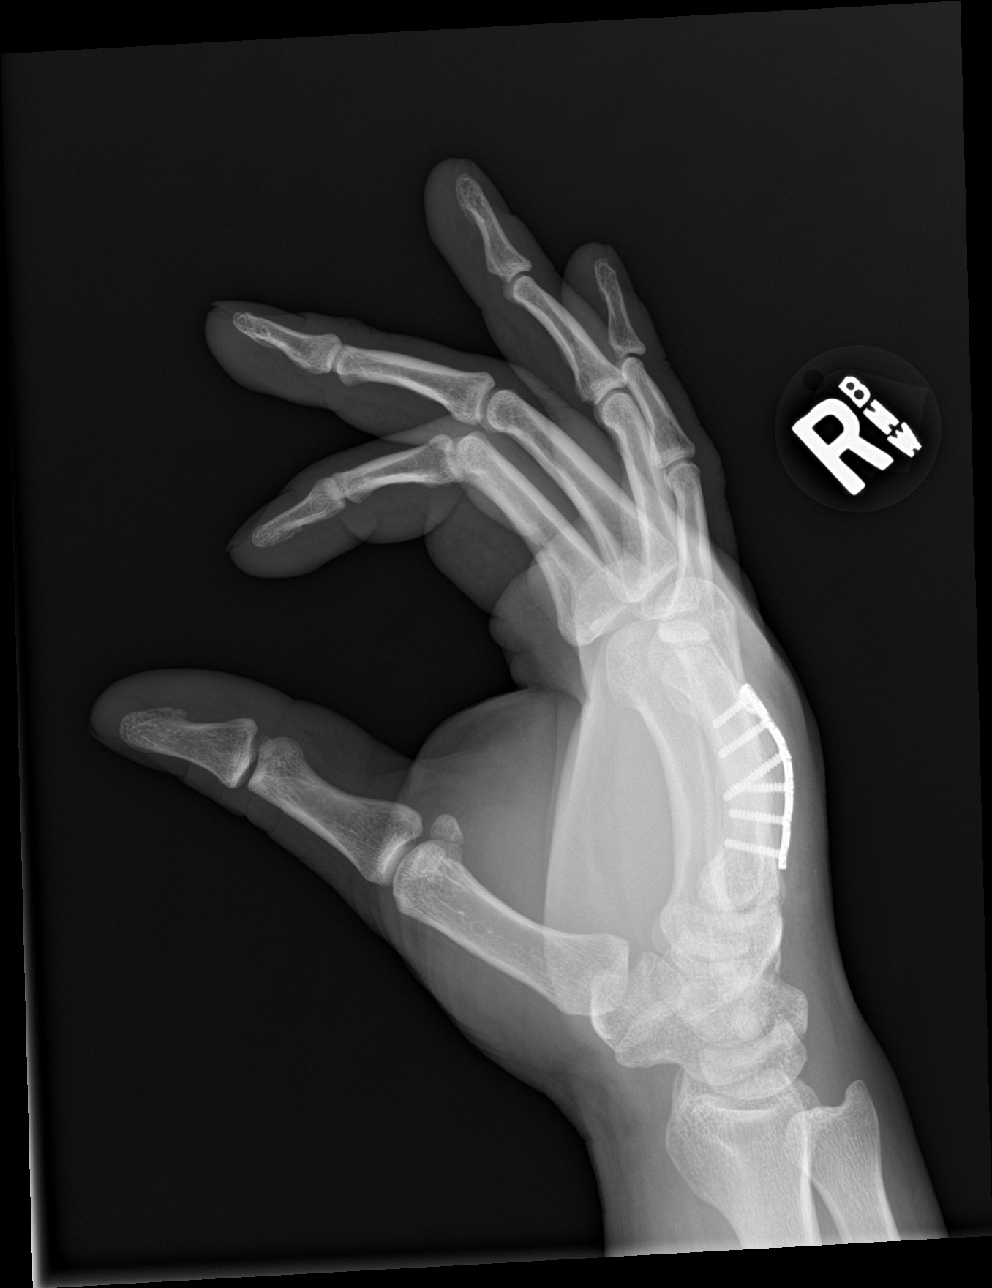

[3 of 3 positions shown; findings below may reference images not displayed]

FINDINGS: Old healed 5th metacarpal fracture. Plate and screw fixation noted
in the 4th metacarpal across healing mid right 4th metacarpal
fracture. Fracture line is partially evident. No acute fracture,
subluxation or dislocation.
IMPRESSION: Plate and screw fixation device across healing partially evident mid
right 4th metacarpal fracture. Old healed right 5th metacarpal
fracture. No acute findings.

## 2021-10-06 ENCOUNTER — Encounter: Payer: Medicaid Other | Attending: *Deleted | Admitting: Nutrition

## 2021-10-06 VITALS — Ht 67.0 in | Wt 258.0 lb

## 2021-10-06 DIAGNOSIS — E782 Mixed hyperlipidemia: Secondary | ICD-10-CM | POA: Insufficient documentation

## 2021-10-06 DIAGNOSIS — E1165 Type 2 diabetes mellitus with hyperglycemia: Secondary | ICD-10-CM | POA: Insufficient documentation

## 2021-10-06 DIAGNOSIS — Z6841 Body Mass Index (BMI) 40.0 and over, adult: Secondary | ICD-10-CM | POA: Insufficient documentation

## 2021-10-06 DIAGNOSIS — E118 Type 2 diabetes mellitus with unspecified complications: Secondary | ICD-10-CM | POA: Insufficient documentation

## 2021-10-06 NOTE — Progress Notes (Signed)
Medical Nutrition Therapy  Appointment Start time: [id number]  Appointment End time:  52  Primary concerns today: Dm Type 2 , Obesity and Hyperlipidemia and GERD  Referral diagnosis: E11.8 Preferred learning style: No preference.  Learning readiness: Change in progress    NUTRITION ASSESSMENT  89 y rold B male recently dx with Type 2 Dm  Metformin 500 mg Once a day and will increase to 2 a day next week. A1C > 14%. Sees [facility] for his care. Here with his fiance'. Eats 1-2 meals per day. Skips lunch or breakfast usually. Wants to learn how to control his DM and get off diabetes medications and lose weight. FBS 275 mg/dl. PCP Royce Macadamia, PAC- [facility] for her care.  10-02-21 Glu 444 mg/dl,  TCHO  high 235 mg/dl. HDL los 34 mg/dl, TG high 504 mg/dl. LIpase  81 high.  Current diet is insuffient to meet his needs and contributes to his DM and Obesity.   Anthropometrics  Wt Readings from Last 3 Encounters:  10/06/21 258 lb (117 kg)  03/14/19 250 lb (113.4 kg)  09/19/16 280 lb (127 kg)   Ht Readings from Last 3 Encounters:  10/06/21 5\' 7"  (1.702 m)  03/14/19 5\' 7"  (1.702 m)  09/19/16 5\' 6"  (1.676 m)   Body mass index is 40.41 kg/m. @BMIFA @ Facility age limit for growth percentiles is 20 years. Facility age limit for growth percentiles is 20 years.    Clinical Medical Hx: DM Type 2, Obesity, HTN, GOUT, Medications: Metformin 500 mg BID. Labs: No results found for: HGBA1C Notable Signs/Symptoms: Reflux, increase thirst, frequent urination, hunger, fatigue and heache. Some feels of fainting.  Lifestyle & Dietary Hx LIving with his fiance. His wife cooks Both do shopping. Eats 1-2 meals per day.   Estimated daily fluid intake: 2 gallons of water per day oz Supplements:  Sleep: 6-7 hrs Stress / self-care: none Current average weekly physical activity: Started going to the gym,  24-Hr Dietary Recall First Meal: Eggs, sausage, bacon, some OJ. Snack:  Second Meal: skipped.   Snack:  Third Meal: [place] with sauce and donut and 2 2 milk,, water Snack:  Beverages:   Estimated Energy Needs Calories: 1800 Carbohydrate: 200g Protein: 135g Fat: 50g   NUTRITION DIAGNOSIS  NB-1.1 Food and nutrition-related knowledge deficit As related to Diabetes.  As evidenced by A1c >14%.  NUTRITION INTERVENTION  Nutrition education (E-1) on the following topics:  Nutrition and Diabetes education provided on My Plate, CHO counting, meal planning, portion sizes, timing of meals, avoiding snacks between meals unless having a low blood sugar, target ranges for A1C and blood sugars, signs/symptoms and treatment of hyper/hypoglycemia, monitoring blood sugars, taking medications as prescribed, benefits of exercising 30 minutes per day and prevention of complications of DM. Lifestyle Medicine  - Whole Food, Plant Predominant Nutrition is highly recommended: Eat Plenty of vegetables, Mushrooms, fruits, Legumes, Whole Grains, Nuts, seeds in lieu of processed meats, processed snacks/pastries red meat, poultry, eggs.    -It is better to avoid simple carbohydrates including: Cakes, Sweet Desserts, Ice Cream, Soda (diet and regular), Sweet Tea, Candies, Chips, Cookies, Store Bought Juices, Alcohol in Excess of  1-2 drinks a day, Lemonade,  Artificial Sweeteners, Doughnuts, Coffee Creamers, "Sugar-free" Products, etc, etc.  This is not a complete list.....  Exercise: If you are able: 30 -60 minutes a day ,4 days a week, or 150 minutes a week.  The longer the better.  Combine stretch, strength, and aerobic activities.  If you were told  in the past that you have high risk for cardiovascular diseases, you may seek evaluation by your heart doctor prior to initiating moderate to intense exercise programs.   Handouts Provided Include  Lifestyle Medicine handouts  Meal Plan Card  Learning Style & Readiness for Change Teaching method utilized: Visual & Auditory  Demonstrated degree of  understanding via: Teach Back  Barriers to learning/adherence to lifestyle change:    Goals Established by Pt Goals  Eat three meals per day at times discussed. Don't skip meals Focus on whole plant based foods. Cut out junk food and processed foods Drink ONLY water-no sodas, juices or artificial sweeteners. Test blood sugars in am before breakfast and at bedtime. Keep a log of blood sugars Get in 150 minutes of exercise weekly Lose 1 lb per week Get A1C down to 7% or below.   MONITORING & EVALUATION Dietary intake, weekly physical activity, and blood sugars  in 1 month.  Next Steps  Patient is to work on better meal planning and exercise.Marland Kitchen

## 2021-10-22 ENCOUNTER — Encounter: Payer: Self-pay | Admitting: Nutrition

## 2021-10-22 NOTE — Patient Instructions (Addendum)
Goals  Eat three meals per day at times discussed. Don't skip meals Focus on whole plant based foods. Cut out junk food and processed foods Drink ONLY water-no sodas, juices or artificial sweeteners. Test blood sugars in am before breakfast and at bedtime. Keep a log of blood sugars Get in 150 minutes of exercise weekly Lose 1 lb per week Get A1C down to 7% or below.  Lifestyle Medicine  - Whole Food, Plant Predominant Nutrition is highly recommended: Eat Plenty of vegetables, Mushrooms, fruits, Legumes, Whole Grains, Nuts, seeds in lieu of processed meats, processed snacks/pastries red meat, poultry, eggs.    -It is better to avoid simple carbohydrates including: Cakes, Sweet Desserts, Ice Cream, Soda (diet and regular), Sweet Tea, Candies, Chips, Cookies, Store Bought Juices, Alcohol in Excess of  1-2 drinks a day, Lemonade,  Artificial Sweeteners, Doughnuts, Coffee Creamers, "Sugar-free" Products, etc, etc.  This is not a complete list.....  Exercise: If you are able: 30 -60 minutes a day ,4 days a week, or 150 minutes a week.  The longer the better.  Combine stretch, strength, and aerobic activities.  If you were told in the past that you have high risk for cardiovascular diseases, you may seek evaluation by your heart doctor prior to initiating moderate to intense exercise programs.

## 2021-11-18 ENCOUNTER — Encounter: Payer: Medicaid Other | Attending: *Deleted | Admitting: Nutrition

## 2021-11-18 ENCOUNTER — Encounter: Payer: Self-pay | Admitting: Nutrition

## 2021-11-18 VITALS — Ht 67.0 in | Wt 272.0 lb

## 2021-11-18 DIAGNOSIS — E1165 Type 2 diabetes mellitus with hyperglycemia: Secondary | ICD-10-CM | POA: Insufficient documentation

## 2021-11-18 DIAGNOSIS — Z6841 Body Mass Index (BMI) 40.0 and over, adult: Secondary | ICD-10-CM | POA: Insufficient documentation

## 2021-11-18 DIAGNOSIS — E782 Mixed hyperlipidemia: Secondary | ICD-10-CM | POA: Insufficient documentation

## 2021-11-18 DIAGNOSIS — E118 Type 2 diabetes mellitus with unspecified complications: Secondary | ICD-10-CM | POA: Insufficient documentation

## 2021-11-18 NOTE — Progress Notes (Signed)
Medical Nutrition Therapy  Appointment Start time: 1120 Appointment End time:  1150  Primary concerns today: Dm Type 2 , Obesity and Hyperlipidemia and GERD  Referral diagnosis: E11.8 Preferred learning style: No preference.  Learning readiness: Change in progress    NUTRITION ASSESSMENT Follow up 20 y rold B male recently dx with Type 2 Dm Has gained 24 lbs since last visit. 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  Regain weight is probably from improved blood sugars and not losing calories from hyperglycemia. Eating much healthier. Cut out red meat and processed foods.  Metformin 500 mg 3 times per day Saw his MD recently. FBS 85-150's.. Bedtime: 103-120's BS are doing much much better.   Work schedule 130 tol 11 pm. CHanges made witih food: no more red meat, eating chicken, [place], steamed vegetables, eating whole grains, eating tofu, eating more vegetables,  Will see her PCP in August 2023.  Digestion is a lot  much better. Sleeping better. No frequent urination, or dry mouth or blurry vision.  PCP Kizzie Furnish, PAC- [facility] for her care.  Anthropometrics  Wt Readings from Last 3 Encounters:  11/18/21 272 lb (123.4 kg)  10/06/21 258 lb (117 kg)  03/14/19 250 lb (113.4 kg)   Ht Readings from Last 3 Encounters:  11/18/21 5\' 7"  (1.702 m)  10/06/21 5\' 7"  (1.702 m)  03/14/19 5\' 7"  (1.702 m)   Body mass index is 42.6 kg/m. @BMIFA @ Facility age limit for growth %iles is 20 years. Facility age limit for growth %iles is 20 years.  Clinical Medical Hx: DM Type 2, Obesity, HTN, GOUT, Medications: Metformin 500 mg BID.  Notable Signs/Symptoms: Reflux, increase thirst, frequent urination, hunger, fatigue and heache. Some feels of fainting.  Lifestyle & Dietary Hx LIving with his fiance. His wife cooks Both do shopping. 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   Estimated daily fluid intake: 2 gallons of water per day oz Supplements:  Sleep: 6-7 hrs Stress / self-care:  none Current average weekly physical activity: Started going to the gym,  24-Hr Dietary Recall First Meal: Eggs, sausage, bacon, some OJ. Snack:  Second Meal: Leftovers, sandwich or salad and fruit. Snack:  Third Meal: Vegetables, fruit and chicken, water Snack:  Beverages: water  Estimated Energy Needs Calories: 1800 Carbohydrate: 200g Protein: 135g Fat: 50g   NUTRITION DIAGNOSIS  NB-1.1 Food and nutrition-related knowledge deficit As related to Diabetes.  As evidenced by A1c >14%.  NUTRITION INTERVENTION  Nutrition education (E-1) on the following topics:  Nutrition and Diabetes education provided on My Plate, CHO counting, meal planning, portion sizes, timing of meals, avoiding snacks between meals unless having a low blood sugar, target ranges for A1C and blood sugars, signs/symptoms and treatment of hyper/hypoglycemia, monitoring blood sugars, taking medications as prescribed, benefits of exercising 30 minutes per day and prevention of complications of DM. Lifestyle Medicine  - Whole Food, Plant Predominant Nutrition is highly recommended: Eat Plenty of vegetables, Mushrooms, fruits, Legumes, Whole Grains, Nuts, seeds in lieu of processed meats, processed snacks/pastries red meat, poultry, eggs.    -It is better to avoid simple carbohydrates including: Cakes, Sweet Desserts, Ice Cream, Soda (diet and regular), Sweet Tea, Candies, Chips, Cookies, Store Bought Juices, Alcohol in Excess of  1-2 drinks a day, Lemonade,  Artificial Sweeteners, Doughnuts, Coffee Creamers, "Sugar-free" Products, etc, etc.  This is not a complete list.....  Exercise: If you are able: 30 -60 minutes a day ,4 days a week, or 150 minutes a week.  The longer the better.  Combine stretch, strength, and aerobic activities.  If you were told in the past that you have high risk for cardiovascular diseases, you may seek evaluation by your heart doctor prior to initiating moderate to intense exercise  programs.   Handouts Provided Include  Lifestyle Medicine handouts  Meal Plan Card  Learning Style & Readiness for Change Teaching method utilized: Visual & Auditory  Demonstrated degree of understanding via: Teach Back  Barriers to learning/adherence to lifestyle change:    Goals Established by Pt  Increase by walking  three times per week for 30-45 minutes Lose 1 per week Continue to eat fruits, vegetables and whole plant based foods. Get A1C  goal 5.7%    MONITORING & EVALUATION Dietary intake, weekly physical activity, and blood sugars  in 1 month.  Next Steps  Patient is to work on better meal planning and exercise.Marland Kitchen

## 2021-11-18 NOTE — Patient Instructions (Addendum)
Goals  Increase by walking  three times per week for 30-45 minutes Lose 1 per week Continue to eat fruits, vegetables and whole plant based foods. Get A1C  goal 5.7%

## 2022-01-18 ENCOUNTER — Ambulatory Visit: Payer: Medicaid Other | Admitting: Nutrition

## 2022-01-21 ENCOUNTER — Encounter: Payer: Medicaid Other | Admitting: Nutrition

## 2022-02-16 ENCOUNTER — Telehealth: Payer: Self-pay | Admitting: Nutrition

## 2022-02-16 ENCOUNTER — Encounter: Payer: Medicaid Other | Admitting: Nutrition

## 2022-02-16 NOTE — Telephone Encounter (Signed)
Vm left to call and  r/s missed appt. 

## 2022-06-08 ENCOUNTER — Encounter: Payer: Self-pay | Admitting: Nutrition

## 2022-09-22 ENCOUNTER — Emergency Department (HOSPITAL_COMMUNITY)
Admission: EM | Admit: 2022-09-22 | Discharge: 2022-09-22 | Disposition: A | Discharge status: Home / Self Care | Payer: Medicaid Other | Attending: Emergency Medicine | Admitting: Emergency Medicine

## 2022-09-22 ENCOUNTER — Emergency Department (HOSPITAL_COMMUNITY): Payer: Medicaid Other

## 2022-09-22 ENCOUNTER — Encounter (HOSPITAL_COMMUNITY): Payer: Self-pay

## 2022-09-22 ENCOUNTER — Other Ambulatory Visit: Payer: Self-pay

## 2022-09-22 DIAGNOSIS — W010XXA Fall on same level from slipping, tripping and stumbling without subsequent striking against object, initial encounter: Secondary | ICD-10-CM | POA: Insufficient documentation

## 2022-09-22 DIAGNOSIS — S6991XA Unspecified injury of right wrist, hand and finger(s), initial encounter: Secondary | ICD-10-CM | POA: Diagnosis present

## 2022-09-22 DIAGNOSIS — W19XXXA Unspecified fall, initial encounter: Secondary | ICD-10-CM

## 2022-09-22 MED ORDER — OXYCODONE-ACETAMINOPHEN 5-325 MG PO TABS
1.0000 | ORAL_TABLET | Freq: Once | ORAL | Status: AC
  Administered 2022-09-22: 1 via ORAL
  Filled 2022-09-22: qty 1

## 2022-09-22 NOTE — ED Notes (Signed)
Pt able to move right hand and wrist well.  Unable to move middle to pinky finger.  Small amt swelling to fingers

## 2022-09-22 NOTE — Discharge Instructions (Signed)
As we discussed, your workup in the ER today was reassuring for acute findings.  X-ray imaging of your hand did not reveal any new fracture or dislocation.  I have placed you in a brace to help support your wrist and hand recommend that you wear at when doing activities.  You may remove it to shower.  I also recommend that you rest, ice, compress, and elevate your hand and wrist to facilitate healing and take Tylenol/ibuprofen as needed for pain.  I have also given you a referral to our hand specialist with a number to call to schedule an appointment for follow-up.  Please give them a call at your earliest convenience.  Return if development of any new or worsening symptoms.

## 2022-09-22 NOTE — ED Triage Notes (Signed)
Had metal plate put in hand in 2020 and has fallen on it. The hand is swollen and pinky finger mis-aligned.

## 2022-09-22 NOTE — ED Provider Notes (Signed)
[place] EMERGENCY DEPARTMENT AT [facility] Provider Note   CSN: [id number] Arrival date & time: 09/22/22  1329     History  Chief Complaint  Patient presents with   Hand Injury    Andrew Ritter is a 32 y.o. male.  Patient with no pertinent past medical history presents today with complaints of fall. He states that same occurred yesterday evening when he tripped and fell and caught himself with his right hand.  He did not hit his head or lose consciousness.  Pain is isolated to his right hand.  Does note that he has had surgery on his hand many years ago with a plate and screws in his fourth metacarpal.  He is having difficulty ranging his hand without significant pain.  Denies fevers or chills.  No other injuries or complaints.  The history is provided by the patient. No language interpreter was used.  Hand Injury      Home Medications Prior to Admission medications   Medication Sig Start Date End Date Taking? Authorizing Provider  benzonatate (TESSALON) 100 MG capsule Take 2 capsules (200 mg total) by mouth 3 (three) times daily as needed for cough. Swallow whole, do not chew 07/27/16   Triplett, Tammy, PA-C  colchicine 0.6 MG tablet Take 1 tablet (0.6 mg total) by mouth daily. 01/10/19   Ivery Quale, PA-C  dexamethasone (DECADRON) 4 MG tablet Take 1 tablet (4 mg total) by mouth 2 (two) times daily with a meal. 01/10/19   Ivery Quale, PA-C  guaiFENesin (MUCINEX) 600 MG 12 hr tablet Take 600 mg by mouth 2 (two) times daily.    [provider]  HYDROcodone-acetaminophen (NORCO/VICODIN) 5-325 MG tablet Take 1 tablet by mouth every 4 (four) hours as needed. 01/10/19   Ivery Quale, PA-C  ibuprofen (ADVIL) 600 MG tablet Take 1 tablet (600 mg total) by mouth 4 (four) times daily. 03/14/19   Ivery Quale, PA-C  oseltamivir (TAMIFLU) 75 MG capsule Take 1 capsule (75 mg total) by mouth every 12 (twelve) hours. 09/19/16   Triplett, Tammy, PA-C  predniSONE  (DELTASONE) 20 MG tablet Take 2 tablets (40 mg total) by mouth daily. for 5 days 07/27/16   Triplett, Tammy, PA-C  traMADol (ULTRAM) 50 MG tablet Take 1 tablet (50 mg total) by mouth every 6 (six) hours as needed. 03/14/19   Ivery Quale, PA-C      Allergies    Shrimp [shellfish allergy] and Other    Review of Systems   Review of Systems  Musculoskeletal:  Positive for arthralgias.  All other systems reviewed and are negative.   Physical Exam Updated Vital Signs BP (!) 149/100 (BP Location: Right Arm)   Pulse (!) 105   Temp 98.2 F (36.8 C) (Oral)   Resp 18   Ht 5\' 7"  (1.702 m)   Wt 118.8 kg   SpO2 97%   BMI 41.04 kg/m  Physical Exam Vitals and nursing note reviewed.  Constitutional:      General: He is not in acute distress.    Appearance: Normal appearance. He is normal weight. He is not ill-appearing, toxic-appearing or diaphoretic.  HENT:     Head: Normocephalic and atraumatic.  Cardiovascular:     Rate and Rhythm: Normal rate.  Pulmonary:     Effort: Pulmonary effort is normal. No respiratory distress.  Musculoskeletal:        General: Normal range of motion.     Cervical back: Normal range of motion.  Comments: TTP of the dorsal aspect of the right hand over the 4th and 5th metacarpals. ROM grossly intact but somewhat limited due to pain. Compartments soft, capillary refill less than 2 seconds. No erythema or warmth. No obvious deformity. Well healed surgical scar present.  Skin:    General: Skin is warm and dry.  Neurological:     General: No focal deficit present.     Mental Status: He is alert.  Psychiatric:        Mood and Affect: Mood normal.        Behavior: Behavior normal.     ED Results / Procedures / Treatments   Labs (all labs ordered are listed, but only abnormal results are displayed) Labs Reviewed - No data to display  EKG None  Radiology DG Hand Complete Right  Result Date: 09/22/2022 CLINICAL DATA:  Fall, hand pain, previous fourth  metacarpal ORIF EXAM: RIGHT HAND - COMPLETE 3+ VIEW COMPARISON:  03/14/2019 FINDINGS: Remote right fourth metacarpal plate screw fixation. Also there is a healed deformity of the right fifth metacarpal from prior fracture. Overall stable appearance. No acute osseous finding, fracture, malalignment, or joint abnormality. IMPRESSION: 1. Remote right fourth and fifth metacarpal fractures. 2. No acute finding by plain radiography. Electronically Signed   By: Judie Petit.  Shick M.D.   On: 09/22/2022 13:58    Procedures Procedures    Medications Ordered in ED Medications  oxyCODONE-acetaminophen (PERCOCET/ROXICET) 5-325 MG per tablet 1 tablet (has no administration in time range)    ED Course/ Medical Decision Making/ A&P                             Medical Decision Making Amount and/or Complexity of Data Reviewed Radiology: ordered.  Risk Prescription drug management.   Patient presents today with complaints of fall with right hand injury. He is afebrile, non-toxic appearing, and in no acute distress with reassuring vital signs. Physical exam performed per above findings. X-ray imaging obtained and has resulted and reveals  1. Remote right fourth and fifth metacarpal fractures. 2. No acute finding by plain radiography.  I have personally reviewed and interpreted this imaging and agree with radiology interpretation.  Patient given bracing for support and referral to hand surgery for follow-up evaluation for possible missed or underlying injury not seen on x-ray.  Pain managed in ED. Conservative therapy with RICE and Tylenol/ibuprofen for pain recommended and discussed. Evaluation and diagnostic testing in the emergency department does not suggest an emergent condition requiring admission or immediate intervention beyond what has been performed at this time.  Plan for discharge with close PCP follow-up.  Patient is understanding and amenable with plan, educated on red flag symptoms that would prompt  immediate return.  Patient discharged in stable condition.  Final Clinical Impression(s) / ED Diagnoses Final diagnoses:  Injury of right hand, initial encounter  Fall, initial encounter    Rx / DC Orders ED Discharge Orders     None     An After Visit Summary was printed and given to the patient.     Vear Clock 09/22/22 1518    Terrilee Files, MD 09/22/22 1726

## 2023-03-01 ENCOUNTER — Emergency Department (HOSPITAL_COMMUNITY)
Admission: EM | Admit: 2023-03-01 | Discharge: 2023-03-01 | Discharge status: Against Medical Advice | Payer: Medicaid Other | Source: Home / Self Care

## 2023-03-01 DIAGNOSIS — K5730 Diverticulosis of large intestine without perforation or abscess without bleeding: Secondary | ICD-10-CM | POA: Diagnosis not present

## 2023-03-01 DIAGNOSIS — R1084 Generalized abdominal pain: Secondary | ICD-10-CM | POA: Diagnosis not present

## 2023-03-01 DIAGNOSIS — K760 Fatty (change of) liver, not elsewhere classified: Secondary | ICD-10-CM | POA: Diagnosis not present

## 2023-03-01 DIAGNOSIS — R109 Unspecified abdominal pain: Secondary | ICD-10-CM | POA: Diagnosis not present

## 2023-03-01 DIAGNOSIS — Z91013 Allergy to seafood: Secondary | ICD-10-CM | POA: Diagnosis not present

## 2023-03-01 DIAGNOSIS — K219 Gastro-esophageal reflux disease without esophagitis: Secondary | ICD-10-CM | POA: Diagnosis not present

## 2023-03-01 DIAGNOSIS — R197 Diarrhea, unspecified: Secondary | ICD-10-CM | POA: Diagnosis not present

## 2023-03-01 DIAGNOSIS — R1010 Upper abdominal pain, unspecified: Secondary | ICD-10-CM | POA: Diagnosis not present

## 2023-03-01 DIAGNOSIS — R1013 Epigastric pain: Secondary | ICD-10-CM | POA: Diagnosis not present

## 2023-03-03 ENCOUNTER — Encounter: Payer: Self-pay | Admitting: Internal Medicine

## 2023-03-03 ENCOUNTER — Ambulatory Visit (INDEPENDENT_AMBULATORY_CARE_PROVIDER_SITE_OTHER): Payer: 59 | Admitting: Internal Medicine

## 2023-03-03 VITALS — BP 147/104 | HR 88 | Temp 97.9°F | Ht 67.0 in | Wt 285.6 lb

## 2023-03-03 DIAGNOSIS — R112 Nausea with vomiting, unspecified: Secondary | ICD-10-CM | POA: Diagnosis not present

## 2023-03-03 DIAGNOSIS — R1310 Dysphagia, unspecified: Secondary | ICD-10-CM

## 2023-03-03 DIAGNOSIS — R1013 Epigastric pain: Secondary | ICD-10-CM | POA: Diagnosis not present

## 2023-03-03 DIAGNOSIS — R1319 Other dysphagia: Secondary | ICD-10-CM

## 2023-03-03 DIAGNOSIS — K219 Gastro-esophageal reflux disease without esophagitis: Secondary | ICD-10-CM | POA: Diagnosis not present

## 2023-03-03 DIAGNOSIS — K921 Melena: Secondary | ICD-10-CM

## 2023-03-03 DIAGNOSIS — G8929 Other chronic pain: Secondary | ICD-10-CM

## 2023-03-03 MED ORDER — PANTOPRAZOLE SODIUM 40 MG PO TBEC: 40.0000 mg | DELAYED_RELEASE_TABLET | Freq: Two times a day (BID) | ORAL | 11 refills | Status: AC

## 2023-03-03 MED ORDER — SUCRALFATE 1 G PO TABS: 1.0000 g | ORAL_TABLET | Freq: Four times a day (QID) | ORAL | 1 refills | Status: DC

## 2023-03-03 NOTE — Progress Notes (Signed)
Primary Care Physician:  Patient, No Pcp Per Primary Gastroenterologist:  Dr. Marletta Lor  Chief Complaint  Patient presents with   New Patient (Initial Visit)    Pt referred from ED for acid reflux    HPI:   Andrew Ritter is a 32 y.o. male who presents to clinic today as a new patient.  Multiple GI complaints for me.  Presented to ER 03/01/2023 with abdominal pain and diarrhea.  Abdominal pain mostly epigastric, mild to moderate.  Also with associated nausea and vomiting.  This has improved.  Does note some intermittent esophageal dysphagia as well.  Feels as if food will get stuck in his substernal region.  This is very infrequent however.  CT abdomen pelvis with IV contrast largely unremarkable besides hepatic steatosis, situs inversus.  Blood work largely unremarkable besides elevated creatinine 1.53.  EKG abnormal though this was possibly due to lead reversal in the setting of situs inversus.  High-sensitivity troponin x 2 WNL.  Was given cardiology referral.  Started on Protonix.  Today, states his symptoms are slightly improved since starting Protonix.  Continues to have issues with pain however.  Past Medical History:  Diagnosis Date   Right sided heart     Past Surgical History:  Procedure Laterality Date   HAND SURGERY Right    screws placed    Current Outpatient Medications  Medication Sig Dispense Refill   pantoprazole (PROTONIX) 40 MG tablet Take 40 mg by mouth daily.     benzonatate (TESSALON) 100 MG capsule Take 2 capsules (200 mg total) by mouth 3 (three) times daily as needed for cough. Swallow whole, do not chew (Patient not taking: Reported on 03/03/2023) 21 capsule 0   colchicine 0.6 MG tablet Take 1 tablet (0.6 mg total) by mouth daily. (Patient not taking: Reported on 03/03/2023) 6 tablet 0   dexamethasone (DECADRON) 4 MG tablet Take 1 tablet (4 mg total) by mouth 2 (two) times daily with a meal. (Patient not taking: Reported on 03/03/2023) 10 tablet 0    guaiFENesin (MUCINEX) 600 MG 12 hr tablet Take 600 mg by mouth 2 (two) times daily. (Patient not taking: Reported on 03/03/2023)     HYDROcodone-acetaminophen (NORCO/VICODIN) 5-325 MG tablet Take 1 tablet by mouth every 4 (four) hours as needed. (Patient not taking: Reported on 03/03/2023) 12 tablet 0   ibuprofen (ADVIL) 600 MG tablet Take 1 tablet (600 mg total) by mouth 4 (four) times daily. (Patient not taking: Reported on 03/03/2023) 30 tablet 0   oseltamivir (TAMIFLU) 75 MG capsule Take 1 capsule (75 mg total) by mouth every 12 (twelve) hours. (Patient not taking: Reported on 03/03/2023) 10 capsule 0   predniSONE (DELTASONE) 20 MG tablet Take 2 tablets (40 mg total) by mouth daily. for 5 days (Patient not taking: Reported on 03/03/2023) 10 tablet 0   traMADol (ULTRAM) 50 MG tablet Take 1 tablet (50 mg total) by mouth every 6 (six) hours as needed. (Patient not taking: Reported on 03/03/2023) 12 tablet 0   No current facility-administered medications for this visit.    Allergies as of 03/03/2023 - Review Complete 03/03/2023  Allergen Reaction Noted   Shrimp [shellfish allergy] Anaphylaxis 03/14/2019   Other  03/14/2019    Family History  Problem Relation Age of Onset   Hypertension Mother    Hypertension Father     Social History   Socioeconomic History   Marital status: Single    Spouse name: Not on file   Number of children:  Not on file   Years of education: Not on file   Highest education level: Not on file  Occupational History   Not on file  Tobacco Use   Smoking status: Never   Smokeless tobacco: Never  Vaping Use   Vaping status: Former  Substance and Sexual Activity   Alcohol use: No   Drug use: Not Currently    Types: Marijuana   Sexual activity: Not on file  Other Topics Concern   Not on file  Social History Narrative   Not on file   Social Determinants of Health   Financial Resource Strain: Not on file  Food Insecurity: Not on file  Transportation  Needs: Not on file  Physical Activity: Not on file  Stress: Not on file  Social Connections: Not on file  Intimate Partner Violence: Not on file    Subjective: Review of Systems  Constitutional:  Negative for chills and fever.  HENT:  Negative for congestion and hearing loss.   Eyes:  Negative for blurred vision and double vision.  Respiratory:  Negative for cough and shortness of breath.   Cardiovascular:  Negative for chest pain and palpitations.  Gastrointestinal:  Positive for abdominal pain, heartburn and nausea. Negative for blood in stool, constipation, diarrhea, melena and vomiting.  Genitourinary:  Negative for dysuria and urgency.  Musculoskeletal:  Negative for joint pain and myalgias.  Skin:  Negative for itching and rash.  Neurological:  Negative for dizziness and headaches.  Psychiatric/Behavioral:  Negative for depression. The patient is not nervous/anxious.        Objective: BP (!) 147/104   Pulse 88   Temp 97.9 F (36.6 C)   Ht 5\' 7"  (1.702 m)   Wt 285 lb 9.6 oz (129.5 kg)   BMI 44.73 kg/m  Physical Exam Constitutional:      Appearance: Normal appearance. He is obese.  HENT:     Head: Normocephalic and atraumatic.  Eyes:     Extraocular Movements: Extraocular movements intact.     Conjunctiva/sclera: Conjunctivae normal.  Cardiovascular:     Rate and Rhythm: Normal rate and regular rhythm.  Pulmonary:     Effort: Pulmonary effort is normal.     Breath sounds: Normal breath sounds.  Abdominal:     General: Bowel sounds are normal.     Palpations: Abdomen is soft.  Musculoskeletal:        General: Normal range of motion.     Cervical back: Normal range of motion and neck supple.  Skin:    General: Skin is warm.  Neurological:     General: No focal deficit present.     Mental Status: He is alert and oriented to person, place, and time.  Psychiatric:        Mood and Affect: Mood normal.        Behavior: Behavior normal.       Assessment: *Epigastric pain, new *Chronic GERD *Nausea and vomiting *Esophageal dysphagia-mild  Plan: Etiology of patient's symptoms unclear.  Differential includes peptic ulcer disease, esophagitis, gastritis, H. Pylori, duodenitis, or other.    Fortunately his CT abdomen pelvis is reassuring.  Discussed pursuing upper endoscopy versus trial of aggressive PPI therapy plus Carafate and patient states he would like to try medications first.  Will increase pantoprazole to 40 mg twice daily.  Carafate up to 4 times a day as needed for breakthrough symptoms.  Follow-up in 6 weeks, if not improved will proceed with upper endoscopy to further evaluate.  May  need HIDA scan to evaluate for biliary colic.  Follow-up in 6 weeks or sooner if needed.  03/03/2023 3:41 PM   Disclaimer: This note was dictated with voice recognition software. Similar sounding words can inadvertently be transcribed and may not be corrected upon review.

## 2023-03-03 NOTE — Patient Instructions (Signed)
I am going to increase your pantoprazole to twice daily.  I also gave you Carafate to take up to 4 times a day as needed for breakthrough symptoms.  Follow-up in 6 weeks.  If not improved we will proceed with upper endoscopy to further evaluate.  We also may need to evaluate your gallbladder further with a HIDA scan.  If your symptoms do not improve over the next few weeks and let me know and we can expedite the studies.  It was very nice meeting with you today.  Dr. Marletta Lor

## 2023-03-23 ENCOUNTER — Telehealth: Payer: Self-pay

## 2023-03-23 NOTE — Telephone Encounter (Signed)
PA done for Pantoprazole 40mg  done on Cover My Meds.Dx used: K21.9 and R13.19.  Pt approved through 03/23/2023. Pt has been contacted regarding this.

## 2023-04-13 NOTE — Progress Notes (Deleted)
Referring Provider: No ref. provider found Primary Care Physician:  Patient, No Pcp Per Primary GI Physician: Dr. Marletta Lor  No chief complaint on file.   HPI:   Andrew Ritter is a 32 y.o. male presenting today for follow-up of epigastric abdominal pain, nausea/vomiting, esophageal dysphagia.   Patient was last seen in the office 03/03/2023 by Dr. Marletta Lor for initial consult.  Reported abdominal pain, primarily epigastric, associated nausea and vomiting though this had improved.  Also with intermittent esophageal dysphagia with sensation of foods getting stuck in the substernal region.  He had previously been seen in the ER on 10/8.  CT A/P with IV contrast largely unremarkable besides hepatic steatosis, situs inversus.  Blood work largely unremarkable.  He had been started on Protonix in the ER and noted slight improvement by the time of his office visit.  Discussion was had about EGD, but patient preferred to try aggressive medical therapy.  Pantoprazole was increased to 40 mg twice daily and he was started on Carafate 4 times daily as needed with recommendations to follow-up in 6 weeks.   Today:   Past Medical History:  Diagnosis Date   Right sided heart     Past Surgical History:  Procedure Laterality Date   HAND SURGERY Right    screws placed    Current Outpatient Medications  Medication Sig Dispense Refill   pantoprazole (PROTONIX) 40 MG tablet Take 1 tablet (40 mg total) by mouth 2 (two) times daily. 60 tablet 11   sucralfate (CARAFATE) 1 g tablet Take 1 tablet (1 g total) by mouth 4 (four) times daily. 120 tablet 1   No current facility-administered medications for this visit.    Allergies as of 04/14/2023 - Review Complete 03/03/2023  Allergen Reaction Noted   Shrimp [shellfish allergy] Anaphylaxis 03/14/2019   Other  03/14/2019    Family History  Problem Relation Age of Onset   Hypertension Mother    Hypertension Father     Social History   Socioeconomic  History   Marital status: Single    Spouse name: Not on file   Number of children: Not on file   Years of education: Not on file   Highest education level: Not on file  Occupational History   Not on file  Tobacco Use   Smoking status: Never   Smokeless tobacco: Never  Vaping Use   Vaping status: Former  Substance and Sexual Activity   Alcohol use: No   Drug use: Not Currently    Types: Marijuana   Sexual activity: Not on file  Other Topics Concern   Not on file  Social History Narrative   Not on file   Social Determinants of Health   Financial Resource Strain: Not on file  Food Insecurity: Not on file  Transportation Needs: Not on file  Physical Activity: Not on file  Stress: Not on file  Social Connections: Not on file    Review of Systems: Gen: Denies fever, chills, anorexia. Denies fatigue, weakness, weight loss.  CV: Denies chest pain, palpitations, syncope, peripheral edema, and claudication. Resp: Denies dyspnea at rest, cough, wheezing, coughing up blood, and pleurisy. GI: Denies vomiting blood, jaundice, and fecal incontinence.   Denies dysphagia or odynophagia. Derm: Denies rash, itching, dry skin Psych: Denies depression, anxiety, memory loss, confusion. No homicidal or suicidal ideation.  Heme: Denies bruising, bleeding, and enlarged lymph nodes.  Physical Exam: There were no vitals taken for this visit. General:   Alert and oriented. No  distress noted. Pleasant and cooperative.  Head:  Normocephalic and atraumatic. Eyes:  Conjuctiva clear without scleral icterus. Heart:  S1, S2 present without murmurs appreciated. Lungs:  Clear to auscultation bilaterally. No wheezes, rales, or rhonchi. No distress.  Abdomen:  +BS, soft, non-tender and non-distended. No rebound or guarding. No HSM or masses noted. Msk:  Symmetrical without gross deformities. Normal posture. Extremities:  Without edema. Neurologic:  Alert and  oriented x4 Psych:  Normal mood and  affect.    Assessment:     Plan:  ***   Ermalinda Memos, PA-C [facility] 04/14/2023

## 2023-04-14 ENCOUNTER — Ambulatory Visit: Payer: 59 | Admitting: Gastroenterology

## 2023-04-24 NOTE — Progress Notes (Unsigned)
Referring Provider: No ref. provider found Primary Care Physician:  Patient, No Pcp Per Primary GI Physician: Dr. Marletta Lor  No chief complaint on file.   HPI:   Andrew Ritter is a 32 y.o. male  presenting today for follow-up of epigastric abdominal pain, nausea/vomiting, esophageal dysphagia.    Patient was last seen in the office 03/03/2023 by Dr. Marletta Lor for initial consult.  Reported abdominal pain, primarily epigastric, associated nausea and vomiting though this had improved.  Also with intermittent esophageal dysphagia with sensation of foods getting stuck in the substernal region.  He had previously been seen in the ER on 10/8.  CT A/P with IV contrast largely unremarkable besides hepatic steatosis, situs inversus.  Blood work largely unremarkable.  He had been started on Protonix in the ER and noted slight improvement by the time of his office visit.  Discussion was had about EGD, but patient preferred to try aggressive medical therapy.  Pantoprazole was increased to 40 mg twice daily and he was started on Carafate 4 times daily as needed with recommendations to follow-up in 6 weeks.    Today:    Past Medical History:  Diagnosis Date   Right sided heart     Past Surgical History:  Procedure Laterality Date   HAND SURGERY Right    screws placed    Current Outpatient Medications  Medication Sig Dispense Refill   pantoprazole (PROTONIX) 40 MG tablet Take 1 tablet (40 mg total) by mouth 2 (two) times daily. 60 tablet 11   sucralfate (CARAFATE) 1 g tablet Take 1 tablet (1 g total) by mouth 4 (four) times daily. 120 tablet 1   No current facility-administered medications for this visit.    Allergies as of 04/27/2023 - Review Complete 03/03/2023  Allergen Reaction Noted   Shrimp [shellfish allergy] Anaphylaxis 03/14/2019   Other  03/14/2019    Family History  Problem Relation Age of Onset   Hypertension Mother    Hypertension Father     Social History    Socioeconomic History   Marital status: Single    Spouse name: Not on file   Number of children: Not on file   Years of education: Not on file   Highest education level: Not on file  Occupational History   Not on file  Tobacco Use   Smoking status: Never   Smokeless tobacco: Never  Vaping Use   Vaping status: Former  Substance and Sexual Activity   Alcohol use: No   Drug use: Not Currently    Types: Marijuana   Sexual activity: Not on file  Other Topics Concern   Not on file  Social History Narrative   Not on file   Social Determinants of Health   Financial Resource Strain: Not on file  Food Insecurity: Not on file  Transportation Needs: Not on file  Physical Activity: Not on file  Stress: Not on file  Social Connections: Not on file    Review of Systems: Gen: Denies fever, chills, anorexia. Denies fatigue, weakness, weight loss.  CV: Denies chest pain, palpitations, syncope, peripheral edema, and claudication. Resp: Denies dyspnea at rest, cough, wheezing, coughing up blood, and pleurisy. GI: Denies vomiting blood, jaundice, and fecal incontinence.   Denies dysphagia or odynophagia. Derm: Denies rash, itching, dry skin Psych: Denies depression, anxiety, memory loss, confusion. No homicidal or suicidal ideation.  Heme: Denies bruising, bleeding, and enlarged lymph nodes.  Physical Exam: There were no vitals taken for this visit. General:  Alert and oriented. No distress noted. Pleasant and cooperative.  Head:  Normocephalic and atraumatic. Eyes:  Conjuctiva clear without scleral icterus. Heart:  S1, S2 present without murmurs appreciated. Lungs:  Clear to auscultation bilaterally. No wheezes, rales, or rhonchi. No distress.  Abdomen:  +BS, soft, non-tender and non-distended. No rebound or guarding. No HSM or masses noted. Msk:  Symmetrical without gross deformities. Normal posture. Extremities:  Without edema. Neurologic:  Alert and  oriented x4 Psych:   Normal mood and affect.    Assessment:     Plan:  ***   Ermalinda Memos, PA-C [facility]enter[facility] 04/27/2023

## 2023-04-25 ENCOUNTER — Emergency Department (HOSPITAL_COMMUNITY)
Admission: EM | Admit: 2023-04-25 | Discharge: 2023-04-26 | Disposition: A | Discharge status: Home / Self Care | Payer: 59 | Attending: Emergency Medicine | Admitting: Emergency Medicine

## 2023-04-25 ENCOUNTER — Encounter (HOSPITAL_COMMUNITY): Payer: Self-pay

## 2023-04-25 ENCOUNTER — Other Ambulatory Visit: Payer: Self-pay

## 2023-04-25 DIAGNOSIS — Z5321 Procedure and treatment not carried out due to patient leaving prior to being seen by health care provider: Secondary | ICD-10-CM | POA: Insufficient documentation

## 2023-04-25 DIAGNOSIS — H11421 Conjunctival edema, right eye: Secondary | ICD-10-CM | POA: Diagnosis not present

## 2023-04-25 DIAGNOSIS — T7840XA Allergy, unspecified, initial encounter: Secondary | ICD-10-CM | POA: Insufficient documentation

## 2023-04-25 NOTE — ED Triage Notes (Signed)
Pt complaining of having an allergic reaction to something. Right eye is swelling, coughing, can't feel left arm. Started 30 min ago. Did take tylenol cold and benedryl about 10 min ago.

## 2023-04-27 ENCOUNTER — Encounter: Payer: Self-pay | Admitting: Gastroenterology

## 2023-04-27 ENCOUNTER — Ambulatory Visit (INDEPENDENT_AMBULATORY_CARE_PROVIDER_SITE_OTHER): Payer: 59 | Admitting: Gastroenterology

## 2023-04-27 ENCOUNTER — Encounter: Payer: Self-pay | Admitting: *Deleted

## 2023-04-27 VITALS — BP 141/115 | HR 91 | Temp 97.9°F | Ht 67.0 in | Wt 282.6 lb

## 2023-04-27 DIAGNOSIS — R059 Cough, unspecified: Secondary | ICD-10-CM

## 2023-04-27 DIAGNOSIS — K5900 Constipation, unspecified: Secondary | ICD-10-CM | POA: Diagnosis not present

## 2023-04-27 DIAGNOSIS — I10 Essential (primary) hypertension: Secondary | ICD-10-CM | POA: Diagnosis not present

## 2023-04-27 DIAGNOSIS — K760 Fatty (change of) liver, not elsewhere classified: Secondary | ICD-10-CM | POA: Diagnosis not present

## 2023-04-27 DIAGNOSIS — R1310 Dysphagia, unspecified: Secondary | ICD-10-CM | POA: Insufficient documentation

## 2023-04-27 DIAGNOSIS — K219 Gastro-esophageal reflux disease without esophagitis: Secondary | ICD-10-CM | POA: Diagnosis not present

## 2023-04-27 DIAGNOSIS — R112 Nausea with vomiting, unspecified: Secondary | ICD-10-CM

## 2023-04-27 DIAGNOSIS — R1013 Epigastric pain: Secondary | ICD-10-CM

## 2023-04-27 NOTE — Patient Instructions (Addendum)
We will get you scheduled for a swallow study at [facility]  Continue taking pantoprazole; however, I would like for you to take 1 pill first thing in the morning and your second pill about 30 minutes before dinner.  You may stop Carafate.  Follow a GERD diet:  Avoid fried, fatty, greasy, spicy, citrus foods. Avoid caffeine and carbonated beverages. Avoid chocolate. Try eating 4-6 small meals a day rather than 3 large meals. Do not eat within 3 hours of laying down. Prop head of bed up on wood or bricks to create a 6 inch incline.  If you continue to have trouble with constipation after stopping Carafate, you concern MiraLAX 17 g daily in 8 ounces of water or other noncarbonated beverage of your choice.  Instructions for fatty liver: Recommend 1-2# weight loss per week until ideal body weight through exercise & diet. Low fat/cholesterol diet.   Avoid sweets, sodas, fruit juices, sweetened beverages like tea, etc. Gradually increase exercise from 15 min daily up to 1 hr per day 5 days/week. Avoid alcohol.  I recommend that you establish with a primary care provider ASAP for blood pressure and diabetes management. You can also discuss the recent allergic reaction and cough.   I will plan to follow-up with you in 3 months, but will call with results and any additional recommendations after your swallow study.  Ermalinda Memos, PA-C [facility] Gastroenterology

## 2023-04-27 NOTE — Progress Notes (Signed)
Referring Provider: No ref. provider found Primary Care Physician:  Patient, No Pcp Per Primary GI Physician: Dr. Marletta Lor  Chief Complaint  Patient presents with   Follow-up    Coughing with meals. Constipation.     HPI:   Andrew Ritter is a 32 y.o. male  presenting today for follow-up of epigastric abdominal pain, nausea/vomiting, esophageal dysphagia.    Patient was last seen in the office 03/03/2023 by Dr. Marletta Lor for initial consult.  Reported abdominal pain, primarily epigastric, associated nausea and vomiting though this had improved.  Also with intermittent esophageal dysphagia with sensation of foods getting stuck in the substernal region.  He had previously been seen in the ER on 10/8.  CT A/P with IV contrast largely unremarkable besides hepatic steatosis, situs inversus.  Blood work largely unremarkable.  He had been started on Protonix in the ER and noted slight improvement by the time of his office visit.  Discussion was had about EGD, but patient preferred to try aggressive medical therapy.  Pantoprazole was increased to 40 mg twice daily and he was started on Carafate 4 times daily as needed with recommendations to follow-up in 6 weeks.    Today: Upper abdominal pain/burning has resolved. Nausea and vomiting resolved. Coughing after eating or drinking, almost like its going down the wrong way, for the last month. Thought he was sick to tried tylenol cold and flu and [facility] night time medication, but no improvement. No shortness of breath. Continues with occasional food dysphagia having to drink something to get items to go down.   He is still taking pantoprazole twice daily, but is taking both pills in the morning.  He does report waking up with regurgitation about 3 times a month.  He is also taking Carafate 4 times a day but notes that this is causing constipation, having bowel movements every couple of days.  No BRBPR or melena.  Trying to avoid fried/fatty foods.  Rare soda.   He does drink a milkshake about 4 times a week.  Had allergic reaction Monday. Eye swelled up and started having trouble breathing. Went to [employer] and got benadryl. When walking out of [employer], his left arm went numb. Didn't eat or do anything anything different that day.  Had chest pain, no SOB.  Went to the ER, but sat there for more than 4 hours and nothing had been done, so he left as he was feeling better.  Since then, he has been coughing more, not just with eating.  Denies any shortness of breath or recurrent chest pain.  Does not currently have a PCP.  Has previously been seen at the health department, but not in quite some time.  NSAIDs: 81 mg aspirin daily.    Past Medical History:  Diagnosis Date   Diabetes (HCC)    per patient's report   HTN (hypertension)    Right sided heart     Past Surgical History:  Procedure Laterality Date   HAND SURGERY Right    screws placed    Current Outpatient Medications  Medication Sig Dispense Refill   pantoprazole (PROTONIX) 40 MG tablet Take 1 tablet (40 mg total) by mouth 2 (two) times daily. 60 tablet 11   sucralfate (CARAFATE) 1 g tablet Take 1 tablet (1 g total) by mouth 4 (four) times daily. 120 tablet 1   No current facility-administered medications for this visit.    Allergies as of 04/27/2023 - Review Complete 04/27/2023  Allergen Reaction Noted  Shrimp [shellfish allergy] Anaphylaxis 03/14/2019   Other  03/14/2019    Family History  Problem Relation Age of Onset   Hypertension Mother    Hypertension Father     Social History   Socioeconomic History   Marital status: Single    Spouse name: Not on file   Number of children: Not on file   Years of education: Not on file   Highest education level: Not on file  Occupational History   Not on file  Tobacco Use   Smoking status: Never   Smokeless tobacco: Never  Vaping Use   Vaping status: Former  Substance and Sexual Activity   Alcohol use: No   Drug use: Not  Currently    Types: Marijuana   Sexual activity: Not on file  Other Topics Concern   Not on file  Social History Narrative   Not on file   Social Determinants of Health   Financial Resource Strain: Not on file  Food Insecurity: Not on file  Transportation Needs: Not on file  Physical Activity: Not on file  Stress: Not on file  Social Connections: Not on file    Review of Systems: Gen: Denies fever, chills, cold or flulike symptoms, presyncope, syncope. CV: Denies chest pain, palpitations. Resp: Denies dyspnea. GI: See HPI  Heme: See HPI  Physical Exam: BP (!) 141/115 (BP Location: Left Arm, Patient Position: Sitting)   Pulse 91   Temp 97.9 F (36.6 C) (Temporal)   Ht 5\' 7"  (1.702 m)   Wt 282 lb 9.6 oz (128.2 kg)   BMI 44.26 kg/m  General:   Alert and oriented. No distress noted. Pleasant and cooperative.  Head:  Normocephalic and atraumatic. Eyes:  Conjuctiva clear without scleral icterus. Heart:  S1, S2 present without murmurs appreciated. Lungs:  Clear to auscultation bilaterally. No wheezes, rales, or rhonchi. No distress.  Abdomen:  +BS, soft, non-tender and non-distended. No rebound or guarding. No HSM or masses noted. Msk:  Symmetrical without gross deformities. Normal posture. Extremities:  Without edema. Neurologic:  Alert and  oriented x4 Psych:  Normal mood and affect.    Assessment:  32 year old male with history of HTN, diabetes per patient's report, presenting today for follow-up of epigastric abdominal pain, nausea/vomiting, dysphagia.  Epigastric abdominal pain/nausea/vomiting: Resolved with pantoprazole 40 mg twice daily and Carafate 4 times daily.  May have been related to uncontrolled GERD, gastritis, duodenitis.  Unable to rule out prior PUD or H. pylori.  Had considered EGD at last visit, but patient preferred medication management.  As he is doing well, I will have him stop Carafate and monitor for recurrent symptoms.  Dysphagia/postprandial  cough: Reporting symptoms of esophageal dysphagia with food getting stuck in his esophagus that will pass with drinking liquids but also now reporting new onset postprandial cough, almost like foods or liquids or trying to go down the wrong way.  Holding off on EGD in light of uncontrolled hypertension.  Will pursue barium pill esophagram for now.  GERD: Occasional nocturnal GERD possibly secondary to eating late in the evenings, but also patient reports taking pantoprazole 80 mg in the morning rather than 40 mg twice daily.  Counseled on GERD diet/lifestyle and recommended splitting pantoprazole to twice daily dosing.  Constipation: Patient reports constipation since starting Carafate.  As his upper GI symptoms have improved significantly, I will have him hold Carafate and monitor for ongoing constipation.  If constipation continues, advised to start MiraLAX 17 g daily.  Hypertension: Appears to have  chronic, uncontrolled hypertension with systolic typically in the 140s-150s and diastolic in the 90s to 1 teens.  Patient does not currently have a primary care doctor, previously seen by health department.  Recommended establishing with a primary care doctor ASAP for blood pressure control.  Fatty liver: Noted on CT in October 2024.  AST slightly elevated at 47, otherwise LFTs and bilirubin within normal limits.  Likely MASLD in the setting of obesity, uncontrolled hypertension, diabetes per patient's report.  We discussed this today with the importance of weight loss through diet and exercise.  We also discussed the potential for progression to liver fibrosis and cirrhosis.  Will plan to recheck liver enzymes at next office visit.     Plan:  BPE Continue pantoprazole 40 mg.  Recommended taking 1 pill first thing in the morning and second pill about 30 minutes before dinner. Stop Carafate monitor for recurrent abdominal pain/nausea/vomiting. Counseled on GERD diet/lifestyle.  Written instructions  provided on AVS. If constipation continues after stopping Carafate, start MiraLAX 17 g daily. Counseled on fatty liver.  Read instructions provided on AVS. Recommended patient establish with PCP ASAP for blood pressure and diabetes management.  He was provided with a list of local PCPs today. Follow-up in 3 months or sooner if needed.   Ermalinda Memos, PA-C [facility] 04/27/2023

## 2023-05-04 ENCOUNTER — Ambulatory Visit (HOSPITAL_COMMUNITY)
Admission: RE | Admit: 2023-05-04 | Discharge: 2023-05-04 | Disposition: A | Discharge status: Home / Self Care | Payer: 59 | Source: Ambulatory Visit | Attending: Gastroenterology | Admitting: Gastroenterology

## 2023-05-04 DIAGNOSIS — R1310 Dysphagia, unspecified: Secondary | ICD-10-CM | POA: Diagnosis not present

## 2023-05-04 DIAGNOSIS — K219 Gastro-esophageal reflux disease without esophagitis: Secondary | ICD-10-CM | POA: Diagnosis not present

## 2023-05-27 ENCOUNTER — Ambulatory Visit (INDEPENDENT_AMBULATORY_CARE_PROVIDER_SITE_OTHER): Payer: 59 | Admitting: Family Medicine

## 2023-05-27 ENCOUNTER — Encounter: Payer: Self-pay | Admitting: Family Medicine

## 2023-05-27 VITALS — BP 138/90 | HR 95 | Ht 67.0 in | Wt 277.1 lb

## 2023-05-27 DIAGNOSIS — Z114 Encounter for screening for human immunodeficiency virus [HIV]: Secondary | ICD-10-CM | POA: Diagnosis not present

## 2023-05-27 DIAGNOSIS — Z1159 Encounter for screening for other viral diseases: Secondary | ICD-10-CM

## 2023-05-27 DIAGNOSIS — G2581 Restless legs syndrome: Secondary | ICD-10-CM | POA: Diagnosis not present

## 2023-05-27 DIAGNOSIS — E1169 Type 2 diabetes mellitus with other specified complication: Secondary | ICD-10-CM

## 2023-05-27 DIAGNOSIS — E1165 Type 2 diabetes mellitus with hyperglycemia: Secondary | ICD-10-CM | POA: Insufficient documentation

## 2023-05-27 DIAGNOSIS — E785 Hyperlipidemia, unspecified: Secondary | ICD-10-CM | POA: Diagnosis not present

## 2023-05-27 DIAGNOSIS — I10 Essential (primary) hypertension: Secondary | ICD-10-CM

## 2023-05-27 DIAGNOSIS — E038 Other specified hypothyroidism: Secondary | ICD-10-CM

## 2023-05-27 DIAGNOSIS — E559 Vitamin D deficiency, unspecified: Secondary | ICD-10-CM

## 2023-05-27 MED ORDER — GABAPENTIN 300 MG PO CAPS: 300.0000 mg | ORAL_CAPSULE | Freq: Every day | ORAL | 0 refills | Status: DC

## 2023-05-27 MED ORDER — AMLODIPINE BESYLATE 5 MG PO TABS: 5.0000 mg | ORAL_TABLET | Freq: Every day | ORAL | 1 refills | Status: DC

## 2023-05-27 NOTE — Assessment & Plan Note (Signed)
 The patient reported that he was previously prescribed metformin 2000 mg twice daily for his type 2 diabetes by his former provider but has been out of medication for a few months. He denies experiencing symptoms such as polyuria, polyphagia, or polydipsia. We will assess his hemoglobin A1c today and make adjustments to his medication regimen as necessary. The patient reports that his highest blood sugar has been 176, and his lowest has been 120. He was advised to decrease his intake of high-sugar foods and beverages and increase physical activity to improve glycemic control. The patient verbalized understanding and is aware of the plan of care

## 2023-05-27 NOTE — Assessment & Plan Note (Signed)
 Uncontrolled We will initiate therapy on amlodipine  5 mg daily A low-sodium diet of less than 2300 mg daily is recommended, along with increased physical activity of moderate intensity, aiming for 150 minutes weekly. The patient is encouraged to continue with these lifestyle modifications to help manage their blood pressure effectively.  Encouraged to  report to the emergency department if his blood pressure exceeds 180/120 and is accompanied by symptoms such as headaches, chest pain, palpitations, blurred vision, or dizziness. BP Readings from Last 3 Encounters:  05/27/23 (!) 138/90  04/27/23 (!) 141/115  04/25/23 (!) 159/116

## 2023-05-27 NOTE — Progress Notes (Signed)
 New Patient Office Visit  Subjective:  Patient ID: Andrew Ritter, male    DOB: 1990-06-11  Age: 33 y.o. MRN: [id number]  CC:  Chief Complaint  Patient presents with   Establish Care    New patient establishing care. Needs to discuss diabetes and hypertension. Would like to discuss allergy testing. Was diagnosed with restless leg syndrome through urgent care needs to discuss this.     HPI Andrew Ritter is a 33 y.o. male with past medical history of hypertension, type 2 diabetes, and restless leg syndrome presents for establishing care. For the details of today's visit, please refer to the assessment and plan.    Past Medical History:  Diagnosis Date   Diabetes (HCC)    per patient's report   HTN (hypertension)    Right sided heart     Past Surgical History:  Procedure Laterality Date   HAND SURGERY Right    screws placed    Family History  Problem Relation Age of Onset   Hypertension Mother    Hypertension Father     Social History   Socioeconomic History   Marital status: Single    Spouse name: Not on file   Number of children: Not on file   Years of education: Not on file   Highest education level: Some college, no degree  Occupational History   Not on file  Tobacco Use   Smoking status: Never   Smokeless tobacco: Never  Vaping Use   Vaping status: Former  Substance and Sexual Activity   Alcohol use: No   Drug use: Not Currently    Types: Marijuana   Sexual activity: Not on file  Other Topics Concern   Not on file  Social History Narrative   Not on file   Social Drivers of Health   Financial Resource Strain: Low Risk  (05/26/2023)   Overall Financial Resource Strain (CARDIA)    Difficulty of Paying Living Expenses: Not very hard  Food Insecurity: Food Insecurity Present (05/26/2023)   Hunger Vital Sign    Worried About Running Out of Food in the Last Year: Sometimes true    Ran Out of Food in the Last Year: Never true  Transportation Needs:  No Transportation Needs (05/26/2023)   PRAPARE - Administrator, Civil Service (Medical): No    Lack of Transportation (Non-Medical): No  Physical Activity: Unknown (05/26/2023)   Exercise Vital Sign    Days of Exercise per Week: 0 days    Minutes of Exercise per Session: Not on file  Stress: Stress Concern Present (05/26/2023)   [employer] of Occupational Health - Occupational Stress Questionnaire    Feeling of Stress : To some extent  Social Connections: Moderately Integrated (05/26/2023)   Social Connection and Isolation Panel [NHANES]    Frequency of Communication with Friends and Family: More than three times a week    Frequency of Social Gatherings with Friends and Family: Never    Attends Religious Services: 1 to 4 times per year    Active Member of [employer] or Organizations: No    Attends Engineer, Structural: Not on file    Marital Status: Married  Catering Manager Violence: Not on file    ROS Review of Systems  Constitutional:  Negative for fatigue and fever.  Eyes:  Negative for visual disturbance.  Respiratory:  Negative for chest tightness and shortness of breath.   Cardiovascular:  Negative for chest pain and palpitations.  Neurological:  Negative for dizziness and headaches.    Objective:   Today's Vitals: BP (!) 138/90 (BP Location: Left Arm)   Pulse 95   Ht 5' 7 (1.702 m)   Wt 277 lb 1.9 oz (125.7 kg)   SpO2 95%   BMI 43.40 kg/m   Physical Exam HENT:     Head: Normocephalic.     Right Ear: External ear normal.     Left Ear: External ear normal.     Nose: No congestion or rhinorrhea.     Mouth/Throat:     Mouth: Mucous membranes are moist.  Cardiovascular:     Rate and Rhythm: Regular rhythm.     Heart sounds: No murmur heard. Pulmonary:     Effort: No respiratory distress.     Breath sounds: Normal breath sounds.  Neurological:     Mental Status: He is alert.      Assessment & Plan:   Type 2 diabetes mellitus with  hyperglycemia, without long-term current use of insulin (HCC) Assessment & Plan: The patient reported that he was previously prescribed metformin 2000 mg twice daily for his type 2 diabetes by his former provider but has been out of medication for a few months. He denies experiencing symptoms such as polyuria, polyphagia, or polydipsia. We will assess his hemoglobin A1c today and make adjustments to his medication regimen as necessary. The patient reports that his highest blood sugar has been 176, and his lowest has been 120. He was advised to decrease his intake of high-sugar foods and beverages and increase physical activity to improve glycemic control. The patient verbalized understanding and is aware of the plan of care   Orders: -     Microalbumin / creatinine urine ratio -     Hemoglobin A1c  Primary hypertension Assessment & Plan: Uncontrolled We will initiate therapy on amlodipine  5 mg daily A low-sodium diet of less than 2300 mg daily is recommended, along with increased physical activity of moderate intensity, aiming for 150 minutes weekly. The patient is encouraged to continue with these lifestyle modifications to help manage their blood pressure effectively.  Encouraged to  report to the emergency department if his blood pressure exceeds 180/120 and is accompanied by symptoms such as headaches, chest pain, palpitations, blurred vision, or dizziness. BP Readings from Last 3 Encounters:  05/27/23 (!) 138/90  04/27/23 (!) 141/115  04/25/23 (!) 159/116     Orders: -     amLODIPine  Besylate; Take 1 tablet (5 mg total) by mouth daily.  Dispense: 60 tablet; Refill: 1  Restless leg syndrome Assessment & Plan: The patient reports experiencing an involuntary urge to move his leg at night, which is consistent with symptoms of restless leg syndrome. He mentions that he started treatment with gabapentin  300 mg nightly at urgent care but has run out of his medication. We will assess his iron  panel today and reinstate therapy with gabapentin  300 mg nightly. The patient was encouraged to increase his intake of iron-rich foods, such as fortified dairy products, lean meats, lentils, and beans, to support better management of his symptoms. The patient verbalized understanding and is aware of the plan of care.   Orders: -     Iron, TIBC and Ferritin Panel -     Gabapentin ; Take 1 capsule (300 mg total) by mouth at bedtime.  Dispense: 90 capsule; Refill: 0  Vitamin D  deficiency -     VITAMIN D  25 Hydroxy (Vit-D Deficiency, Fractures)  Need for hepatitis C screening test -  Hepatitis C antibody  Encounter for screening for HIV -     HIV Antibody (routine testing w rflx)  TSH (thyroid-stimulating hormone deficiency) -     TSH + free T4  Hyperlipidemia associated with type 2 diabetes mellitus (HCC) -     Lipid panel -     CMP14+EGFR -     CBC with Differential/Platelet   Note: This chart has been completed using Engineer, Civil (consulting) software, and while attempts have been made to ensure accuracy, certain words and phrases may not be transcribed as intended.    Follow-up: Return in about 1 month (around 06/27/2023) for BP and allergy testing.   Langdon Crosson, FNP

## 2023-05-27 NOTE — Patient Instructions (Addendum)
 I appreciate the opportunity to provide care to you today!    Follow up:  1 months  Labs: please stop by the lab today to get your blood drawn (CBC, CMP, TSH, Lipid profile, HgA1c, Vit D)  Screening: HIV and Hep C  Hypertension Management  Your current blood pressure is above the target goal of <140/90 mmHg. To address this, please continue taking amlodipine  5 mg daily  Medication Instructions: Take your blood pressure medication at the same time each day. After taking your medication, check your blood pressure at least an hour later. If your first reading is >140/90 mmHg, wait at least 10 minutes and recheck your blood pressure. Side Effects: In the initial days of therapy, you may experience dizziness or lightheadedness as your body adjusts to the lower blood pressure; this is expected. Diet and Lifestyle: Adhere to a low-sodium diet, limiting intake to less than 1500 mg daily, and increase your physical activity. Avoid over-the-counter NSAIDs such as ibuprofen  and naproxen  while on this medication. Hydration and Nutrition: Stay well-hydrated by drinking at least 64 ounces of water daily. Increase your servings of fruits and vegetables and avoid excessive sodium in your diet. Long-Term Considerations: Uncontrolled hypertension can increase the risk of cardiovascular diseases, including stroke, coronary artery disease, and heart failure.  Please report to the emergency department if your blood pressure exceeds 180/120 and is accompanied by symptoms such as headaches, chest pain, palpitations, blurred vision, or dizziness.   Goal blood sugars:  Fasting blood sugars: 80-130 Before lunch and dinner: less than 140 2 hours after meals: less than 180    Attached with your AVS, you will find valuable resources for self-education. I highly recommend dedicating some time to thoroughly examine them.   Please continue to a heart-healthy diet and increase your physical activities. Try to exercise  for at least five days a week.    It was a pleasure to see you and I look forward to continuing to work together on your health and well-being. Please do not hesitate to call the office if you need care or have questions about your care.  In case of emergency, please visit the Emergency Department for urgent care, or contact our clinic at [phone] to schedule an appointment. We're here to help you!   Have a wonderful day and week. With Gratitude, Prateek Knipple MSN, FNP-BC

## 2023-05-27 NOTE — Assessment & Plan Note (Addendum)
 The patient reports experiencing an involuntary urge to move his leg at night, which is consistent with symptoms of restless leg syndrome. He mentions that he started treatment with gabapentin  300 mg nightly at urgent care but has run out of his medication. We will assess his iron panel today and reinstate therapy with gabapentin  300 mg nightly. The patient was encouraged to increase his intake of iron-rich foods, such as fortified dairy products, lean meats, lentils, and beans, to support better management of his symptoms. The patient verbalized understanding and is aware of the plan of care.

## 2023-05-29 ENCOUNTER — Other Ambulatory Visit: Payer: Self-pay | Admitting: Family Medicine

## 2023-05-29 DIAGNOSIS — E1169 Type 2 diabetes mellitus with other specified complication: Secondary | ICD-10-CM

## 2023-05-29 DIAGNOSIS — E559 Vitamin D deficiency, unspecified: Secondary | ICD-10-CM

## 2023-05-29 DIAGNOSIS — E1165 Type 2 diabetes mellitus with hyperglycemia: Secondary | ICD-10-CM

## 2023-05-29 DIAGNOSIS — E785 Hyperlipidemia, unspecified: Secondary | ICD-10-CM

## 2023-05-29 LAB — CBC WITH DIFFERENTIAL/PLATELET
Basophils Absolute: 0 10*3/uL (ref 0.0–0.2)
Basos: 1 %
EOS (ABSOLUTE): 0.2 10*3/uL (ref 0.0–0.4)
Eos: 3 %
Hematocrit: 50.9 % (ref 37.5–51.0)
Hemoglobin: 17.3 g/dL (ref 13.0–17.7)
Immature Grans (Abs): 0 10*3/uL (ref 0.0–0.1)
Immature Granulocytes: 0 %
Lymphocytes Absolute: 3.4 10*3/uL — ABNORMAL HIGH (ref 0.7–3.1)
Lymphs: 44 %
MCH: 28.8 pg (ref 26.6–33.0)
MCHC: 34 g/dL (ref 31.5–35.7)
MCV: 85 fL (ref 79–97)
Monocytes Absolute: 0.6 10*3/uL (ref 0.1–0.9)
Monocytes: 8 %
Neutrophils Absolute: 3.4 10*3/uL (ref 1.4–7.0)
Neutrophils: 44 %
Platelets: 239 10*3/uL (ref 150–450)
RBC: 6 x10E6/uL — ABNORMAL HIGH (ref 4.14–5.80)
RDW: 14.1 % (ref 11.6–15.4)
WBC: 7.6 10*3/uL (ref 3.4–10.8)

## 2023-05-29 LAB — TSH+FREE T4
Free T4: 1.88 ng/dL — ABNORMAL HIGH (ref 0.82–1.77)
TSH: 1.42 u[IU]/mL (ref 0.450–4.500)

## 2023-05-29 LAB — LIPID PANEL
Chol/HDL Ratio: 6.2 {ratio} — ABNORMAL HIGH (ref 0.0–5.0)
Cholesterol, Total: 216 mg/dL — ABNORMAL HIGH (ref 100–199)
HDL: 35 mg/dL — ABNORMAL LOW (ref >39)
LDL Chol Calc (NIH): 115 mg/dL — ABNORMAL HIGH (ref 0–99)
Triglycerides: 376 mg/dL — ABNORMAL HIGH (ref 0–149)
VLDL Cholesterol Cal: 66 mg/dL — ABNORMAL HIGH (ref 5–40)

## 2023-05-29 LAB — VITAMIN D 25 HYDROXY (VIT D DEFICIENCY, FRACTURES): Vit D, 25-Hydroxy: 7.4 ng/mL — ABNORMAL LOW (ref 30.0–100.0)

## 2023-05-29 LAB — CMP14+EGFR
ALT: 36 [IU]/L (ref 0–44)
AST: 26 [IU]/L (ref 0–40)
Albumin: 4.6 g/dL (ref 4.1–5.1)
Alkaline Phosphatase: 128 [IU]/L — ABNORMAL HIGH (ref 44–121)
BUN/Creatinine Ratio: 8 — ABNORMAL LOW (ref 9–20)
BUN: 9 mg/dL (ref 6–20)
Bilirubin Total: 0.8 mg/dL (ref 0.0–1.2)
CO2: 20 mmol/L (ref 20–29)
Calcium: 10 mg/dL (ref 8.7–10.2)
Chloride: 98 mmol/L (ref 96–106)
Creatinine, Ser: 1.08 mg/dL (ref 0.76–1.27)
Globulin, Total: 3.3 g/dL (ref 1.5–4.5)
Glucose: 322 mg/dL — ABNORMAL HIGH (ref 70–99)
Potassium: 4.5 mmol/L (ref 3.5–5.2)
Sodium: 135 mmol/L (ref 134–144)
Total Protein: 7.9 g/dL (ref 6.0–8.5)
eGFR: 93 mL/min/{1.73_m2} (ref >59)

## 2023-05-29 LAB — IRON,TIBC AND FERRITIN PANEL
Ferritin: 434 ng/mL — ABNORMAL HIGH (ref 30–400)
Iron Saturation: 23 % (ref 15–55)
Iron: 75 ug/dL (ref 38–169)
Total Iron Binding Capacity: 320 ug/dL (ref 250–450)
UIBC: 245 ug/dL (ref 111–343)

## 2023-05-29 LAB — HIV ANTIBODY (ROUTINE TESTING W REFLEX)

## 2023-05-29 LAB — MICROALBUMIN / CREATININE URINE RATIO
Creatinine, Urine: 96.2 mg/dL
Microalb/Creat Ratio: 27 mg/g{creat} (ref 0–29)
Microalbumin, Urine: 26.1 ug/mL

## 2023-05-29 LAB — HEMOGLOBIN A1C
Est. average glucose Bld gHb Est-mCnc: 283 mg/dL
Hgb A1c MFr Bld: 11.5 % — ABNORMAL HIGH (ref 4.8–5.6)

## 2023-05-29 LAB — HEPATITIS C ANTIBODY: Hep C Virus Ab: NONREACTIVE

## 2023-05-29 MED ORDER — LANCET DEVICE MISC: 1.0000 | Freq: Three times a day (TID) | 0 refills | Status: DC

## 2023-05-29 MED ORDER — ROSUVASTATIN CALCIUM 10 MG PO TABS: 10.0000 mg | ORAL_TABLET | Freq: Every day | ORAL | 3 refills | Status: DC

## 2023-05-29 MED ORDER — TIRZEPATIDE 2.5 MG/0.5ML ~~LOC~~ SOAJ: 2.5000 mg | SUBCUTANEOUS | 0 refills | Status: DC

## 2023-05-29 MED ORDER — BLOOD GLUCOSE TEST VI STRP: 1.0000 | ORAL_STRIP | Freq: Three times a day (TID) | 0 refills | Status: DC

## 2023-05-29 MED ORDER — BLOOD GLUCOSE MONITORING SUPPL DEVI: 1.0000 | Freq: Three times a day (TID) | 0 refills | Status: DC

## 2023-05-29 MED ORDER — GLIPIZIDE 5 MG PO TABS: 5.0000 mg | ORAL_TABLET | Freq: Two times a day (BID) | ORAL | 3 refills | Status: DC

## 2023-05-29 MED ORDER — LANCETS MISC. MISC: 1.0000 | Freq: Three times a day (TID) | 0 refills | Status: DC

## 2023-05-29 MED ORDER — VITAMIN D (ERGOCALCIFEROL) 1.25 MG (50000 UNIT) PO CAPS: 50000.0000 [IU] | ORAL_CAPSULE | ORAL | 1 refills | Status: DC

## 2023-05-30 ENCOUNTER — Encounter: Payer: Self-pay | Admitting: Family Medicine

## 2023-05-30 ENCOUNTER — Other Ambulatory Visit: Payer: Self-pay

## 2023-05-30 DIAGNOSIS — E785 Hyperlipidemia, unspecified: Secondary | ICD-10-CM

## 2023-05-30 DIAGNOSIS — I10 Essential (primary) hypertension: Secondary | ICD-10-CM

## 2023-05-30 DIAGNOSIS — E1169 Type 2 diabetes mellitus with other specified complication: Secondary | ICD-10-CM

## 2023-05-30 DIAGNOSIS — E559 Vitamin D deficiency, unspecified: Secondary | ICD-10-CM

## 2023-05-30 DIAGNOSIS — G2581 Restless legs syndrome: Secondary | ICD-10-CM

## 2023-05-30 DIAGNOSIS — E1165 Type 2 diabetes mellitus with hyperglycemia: Secondary | ICD-10-CM

## 2023-05-30 MED ORDER — VITAMIN D (ERGOCALCIFEROL) 1.25 MG (50000 UNIT) PO CAPS: 50000.0000 [IU] | ORAL_CAPSULE | ORAL | 1 refills | Status: DC

## 2023-05-30 MED ORDER — AMLODIPINE BESYLATE 5 MG PO TABS: 5.0000 mg | ORAL_TABLET | Freq: Every day | ORAL | 1 refills | Status: AC

## 2023-05-30 MED ORDER — LANCET DEVICE MISC: 1.0000 | Freq: Three times a day (TID) | 0 refills | Status: AC

## 2023-05-30 MED ORDER — GABAPENTIN 300 MG PO CAPS: 300.0000 mg | ORAL_CAPSULE | Freq: Every day | ORAL | 0 refills | Status: AC

## 2023-05-30 MED ORDER — TIRZEPATIDE 2.5 MG/0.5ML ~~LOC~~ SOAJ: 2.5000 mg | SUBCUTANEOUS | 0 refills | Status: DC

## 2023-05-30 MED ORDER — ROSUVASTATIN CALCIUM 10 MG PO TABS: 10.0000 mg | ORAL_TABLET | Freq: Every day | ORAL | 3 refills | Status: AC

## 2023-05-30 MED ORDER — GLIPIZIDE 5 MG PO TABS: 5.0000 mg | ORAL_TABLET | Freq: Two times a day (BID) | ORAL | 3 refills | Status: DC

## 2023-05-30 MED ORDER — BLOOD GLUCOSE TEST VI STRP: 1.0000 | ORAL_STRIP | Freq: Three times a day (TID) | 0 refills | Status: AC

## 2023-05-30 MED ORDER — LANCETS MISC. MISC: 1.0000 | Freq: Three times a day (TID) | 0 refills | Status: AC

## 2023-05-30 MED ORDER — BLOOD GLUCOSE MONITORING SUPPL DEVI: 1.0000 | Freq: Three times a day (TID) | 0 refills | Status: DC

## 2023-05-30 NOTE — Telephone Encounter (Signed)
 Copied from CRM [id number]. Topic: Clinical - Prescription Issue >> May 30, 2023 11:43 AM Powell HERO wrote: Reason for CRM: tirzepatide  (MOUNJARO ) 2.5 MG/0.5ML Pen, patient went to pick it up and it was going to be over $1000 he wants to know if something that costs less can be sent in for him.

## 2023-06-01 ENCOUNTER — Ambulatory Visit: Payer: Self-pay | Admitting: Family Medicine

## 2023-06-02 NOTE — Telephone Encounter (Signed)
 Copied from CRM [id number]. Topic: Clinical - Prescription Issue >> Jun 01, 2023  4:05 PM Delon DASEN wrote: Reason for CRM: Patient is calling about prescription for tirzepatide  (MOUNJARO ) 2.5 MG/0.5ML Pen, it is too expensive, not covered by insurance, wants to know if he can get something that is covered for less money.  Please call patient [phone]

## 2023-06-03 ENCOUNTER — Other Ambulatory Visit: Payer: Self-pay | Admitting: Family Medicine

## 2023-06-03 DIAGNOSIS — E1165 Type 2 diabetes mellitus with hyperglycemia: Secondary | ICD-10-CM

## 2023-06-03 MED ORDER — EMPAGLIFLOZIN 10 MG PO TABS: 10.0000 mg | ORAL_TABLET | Freq: Every day | ORAL | 3 refills | Status: DC

## 2023-06-03 MED ORDER — TRULICITY 0.75 MG/0.5ML ~~LOC~~ SOAJ: 0.7500 mg | SUBCUTANEOUS | 0 refills | Status: DC

## 2023-06-03 NOTE — Telephone Encounter (Signed)
 A prescription for Trulicity has been sent to the pharmacy, along with Jardiance 10 mg daily.

## 2023-06-20 NOTE — Telephone Encounter (Signed)
PA for Mounjaro 2.5 mg has been denied. Plan only  covers Victoza or Trulicity. Please advise on plan of treatment.

## 2023-06-20 NOTE — Telephone Encounter (Unsigned)
Copied from CRM [id number]. Topic: Clinical - Prescription Issue >> Jun 20, 2023 10:36 AM Zane Herald wrote: Reason for CRM: [employer] called about prior auth for Dulaglutide (TRULICITY) 0.75 MG/0.5ML SOAJ  empagliflozin (JARDIANCE) 10 MG TABS tablet

## 2023-06-27 ENCOUNTER — Telehealth: Payer: Self-pay

## 2023-06-27 ENCOUNTER — Ambulatory Visit: Payer: 59 | Admitting: Family Medicine

## 2023-06-27 NOTE — Telephone Encounter (Signed)
Copied from CRM [id number]. Topic: Clinical - Prescription Issue >> Jun 27, 2023 12:48 PM Elle L wrote: Reason for CRM: The patient needs a prior authorization for his empagliflozin (JARDIANCE) 10 MG TABS tablet AND Dulaglutide (TRULICITY) 0.75 MG/0.5ML SOAJ.

## 2023-07-05 NOTE — Telephone Encounter (Signed)
PA started. According to insurance Alternate medication may be required. Awaiting determination.

## 2023-07-19 ENCOUNTER — Encounter: Payer: Self-pay | Admitting: Gastroenterology

## 2023-07-20 ENCOUNTER — Telehealth: Payer: Self-pay

## 2023-07-20 NOTE — Telephone Encounter (Signed)
 Patient was identified as falling into the [place] Measure - Diabetes.   Patient was: Appointment scheduled for lab or office visit for A1c.

## 2023-07-21 ENCOUNTER — Ambulatory Visit: Payer: 59 | Admitting: Nutrition

## 2023-08-04 ENCOUNTER — Ambulatory Visit (INDEPENDENT_AMBULATORY_CARE_PROVIDER_SITE_OTHER): Payer: 59 | Admitting: Family Medicine

## 2023-08-04 ENCOUNTER — Encounter: Payer: Self-pay | Admitting: Family Medicine

## 2023-08-04 VITALS — BP 142/80 | HR 99 | Ht 67.0 in | Wt 268.1 lb

## 2023-08-04 DIAGNOSIS — I10 Essential (primary) hypertension: Secondary | ICD-10-CM | POA: Diagnosis not present

## 2023-08-04 DIAGNOSIS — E559 Vitamin D deficiency, unspecified: Secondary | ICD-10-CM | POA: Diagnosis not present

## 2023-08-04 DIAGNOSIS — K219 Gastro-esophageal reflux disease without esophagitis: Secondary | ICD-10-CM

## 2023-08-04 MED ORDER — VITAMIN D (ERGOCALCIFEROL) 1.25 MG (50000 UNIT) PO CAPS: 50000.0000 [IU] | ORAL_CAPSULE | ORAL | 1 refills | Status: DC

## 2023-08-04 MED ORDER — AMLODIPINE-OLMESARTAN 5-20 MG PO TABS: 1.0000 | ORAL_TABLET | Freq: Every day | ORAL | 1 refills | Status: DC

## 2023-08-04 NOTE — Progress Notes (Unsigned)
 Established Patient Office Visit  Subjective:  Patient ID: Andrew Ritter, male    DOB: [birth year]  Age: 33 y.o. MRN: [id number]  CC:  Chief Complaint  Patient presents with   Care Management    1 month f/u    Andrew Ritter is a 33 y.o. male with past medical history of Hypetrension presents for f/u of  chronic medical conditions. For the details of today's visit, please refer to the assessment and plan.     Past Medical History:  Diagnosis Date   Diabetes (HCC)    per patient's report   HTN (hypertension)    Right sided heart     Past Surgical History:  Procedure Laterality Date   HAND SURGERY Right    screws placed    Family History  Problem Relation Age of Onset   Hypertension Mother    Hypertension Father     Social History   Socioeconomic History   Marital status: Single    Spouse name: Not on file   Number of children: Not on file   Years of education: Not on file   Highest education level: Some college, no degree  Occupational History   Not on file  Tobacco Use   Smoking status: Never   Smokeless tobacco: Never  Vaping Use   Vaping status: Former  Substance and Sexual Activity   Alcohol use: No   Drug use: Not Currently    Types: Marijuana   Sexual activity: Not on file  Other Topics Concern   Not on file  Social History Narrative   Not on file   Social Drivers of Health   Financial Resource Strain: Low Risk  (05/26/2023)   Overall Financial Resource Strain (CARDIA)    Difficulty of Paying Living Expenses: Not very hard  Food Insecurity: Food Insecurity Present (05/26/2023)   Hunger Vital Sign    Worried About Running Out of Food in the Last Year: Sometimes true    Ran Out of Food in the Last Year: Never true  Transportation Needs: No Transportation Needs (05/26/2023)   PRAPARE - Administrator, Civil Service (Medical): No    Lack of Transportation (Non-Medical): No  Physical Activity: Unknown (05/26/2023)   Exercise  Vital Sign    Days of Exercise per Week: 0 days    Minutes of Exercise per Session: Not on file  Stress: Stress Concern Present (05/26/2023)   [employer] of Occupational Health - Occupational Stress Questionnaire    Feeling of Stress : To some extent  Social Connections: Moderately Integrated (05/26/2023)   Social Connection and Isolation Panel [NHANES]    Frequency of Communication with Friends and Family: More than three times a week    Frequency of Social Gatherings with Friends and Family: Never    Attends Religious Services: 1 to 4 times per year    Active Member of [employer] or Organizations: No    Attends Engineer, structural: Not on file    Marital Status: Married  Catering manager Violence: Not on file    Outpatient Medications Prior to Visit  Medication Sig Dispense Refill   amLODipine (NORVASC) 5 MG tablet Take 1 tablet (5 mg total) by mouth daily. 60 tablet 1   Blood Glucose Monitoring Suppl DEVI 1 each by Does not apply route in the morning, at noon, and at bedtime. May substitute to any manufacturer covered by patient's insurance. 1 each 0   Dulaglutide (TRULICITY) 0.75 MG/0.5ML SOAJ Inject 0.75  mg into the skin once a week. 2 mL 0   empagliflozin (JARDIANCE) 10 MG TABS tablet Take 1 tablet (10 mg total) by mouth daily before breakfast. 30 tablet 3   gabapentin (NEURONTIN) 300 MG capsule Take 1 capsule (300 mg total) by mouth at bedtime. 90 capsule 0   glipiZIDE (GLUCOTROL) 5 MG tablet Take 1 tablet (5 mg total) by mouth 2 (two) times daily before a meal. 60 tablet 3   pantoprazole (PROTONIX) 40 MG tablet Take 1 tablet (40 mg total) by mouth 2 (two) times daily. 60 tablet 11   rosuvastatin (CRESTOR) 10 MG tablet Take 1 tablet (10 mg total) by mouth daily. 90 tablet 3   Vitamin D, Ergocalciferol, (DRISDOL) 1.25 MG (50000 UNIT) CAPS capsule Take 1 capsule (50,000 Units total) by mouth every 7 (seven) days. 5 capsule 1   No facility-administered medications prior to  visit.    Allergies  Allergen Reactions   Shrimp [Shellfish Allergy] Anaphylaxis   Other     Cats-Face swelling and itching Eye drops for pink eye-eyes became more swollen    ROS Review of Systems  Constitutional:  Negative for fatigue and fever.  Eyes:  Negative for visual disturbance.  Respiratory:  Negative for chest tightness and shortness of breath.   Cardiovascular:  Negative for chest pain and palpitations.  Neurological:  Negative for dizziness and headaches.      Objective:    Physical Exam HENT:     Head: Normocephalic.     Right Ear: External ear normal.     Left Ear: External ear normal.     Nose: No congestion or rhinorrhea.     Mouth/Throat:     Mouth: Mucous membranes are moist.  Cardiovascular:     Rate and Rhythm: Regular rhythm.     Heart sounds: No murmur heard. Pulmonary:     Effort: No respiratory distress.     Breath sounds: Normal breath sounds.  Neurological:     Mental Status: He is alert.     BP (!) 142/80 (BP Location: Left Arm)   Pulse 99   Ht 5\' 7"  (1.702 m)   Wt 268 lb 1.3 oz (121.6 kg)   SpO2 98%   BMI 41.99 kg/m  Wt Readings from Last 3 Encounters:  08/04/23 268 lb 1.3 oz (121.6 kg)  05/27/23 277 lb 1.9 oz (125.7 kg)  04/27/23 282 lb 9.6 oz (128.2 kg)    Lab Results  Component Value Date   TSH 1.420 05/27/2023   Lab Results  Component Value Date   WBC 7.6 05/27/2023   HGB 17.3 05/27/2023   HCT 50.9 05/27/2023   MCV 85 05/27/2023   PLT 239 05/27/2023   Lab Results  Component Value Date   NA 135 05/27/2023   K 4.5 05/27/2023   CO2 20 05/27/2023   GLUCOSE 322 (H) 05/27/2023   BUN 9 05/27/2023   CREATININE 1.08 05/27/2023   BILITOT 0.8 05/27/2023   ALKPHOS 128 (H) 05/27/2023   AST 26 05/27/2023   ALT 36 05/27/2023   PROT 7.9 05/27/2023   ALBUMIN 4.6 05/27/2023   CALCIUM 10.0 05/27/2023   ANIONGAP 6 05/28/2014   EGFR 93 05/27/2023   Lab Results  Component Value Date   CHOL 216 (H) 05/27/2023   Lab  Results  Component Value Date   HDL 35 (L) 05/27/2023   Lab Results  Component Value Date   LDLCALC 115 (H) 05/27/2023   Lab Results  Component Value Date  TRIG 376 (H) 05/27/2023   Lab Results  Component Value Date   CHOLHDL 6.2 (H) 05/27/2023   Lab Results  Component Value Date   HGBA1C 11.5 (H) 05/27/2023      Assessment & Plan:  Primary hypertension Assessment & Plan: Uncontrolled on Amlodipine 5 mg Daily The patient is asymptomatic. Will initiate therapy with Olmesartan-Amlodipine 20/5 mg daily.  A low-sodium diet of less than 2,300 mg daily is recommended, along with moderate-intensity physical activity for at least 150 minutes per week. The patient is encouraged to maintain these lifestyle modifications to help manage her blood pressure effectively.  Long-term considerations were discussed, emphasizing that uncontrolled hypertension increases the risk of cardiovascular diseases, including stroke, coronary artery disease, and heart failure.  The patient is encouraged to seek emergency care if blood pressure exceeds 180/120 and is accompanied by symptoms such as headaches, chest pain, palpitations, blurred vision, or dizziness. She verbalized understanding and will follow up as scheduled.  BP Readings from Last 3 Encounters:  08/04/23 (!) 142/80  05/27/23 (!) 138/90  04/27/23 (!) 141/115     Orders: -     amLODIPine-Olmesartan; Take 1 tablet by mouth daily.  Dispense: 30 tablet; Refill: 1  Vitamin D deficiency Assessment & Plan: Encouraged to increase his intake of vitamin D-rich foods such as fatty fish (e.g., salmon, mackerel, and sardines), fortified dairy products, egg yolks, and fortified cereals.   Orders: -     Vitamin D (Ergocalciferol); Take 1 capsule (50,000 Units total) by mouth every 7 (seven) days.  Dispense: 20 capsule; Refill: 1  Note: This chart has been completed using Engineer, civil (consulting) software, and while attempts have been made to  ensure accuracy, certain words and phrases may not be transcribed as intended.    Follow-up: Return in about 1 month (around 09/04/2023).   Gilmore Laroche, FNP

## 2023-08-04 NOTE — Patient Instructions (Addendum)
 I appreciate the opportunity to provide care to you today!    Follow up:  1 months BP  Labs: please stop by the lab today to get your blood drawn (CBC, CMP, TSH, Lipid profile, HgA1c, Vit D)  Hypertension Management  Your current blood pressure is above the target goal of <140/90 mmHg. To address this, please continue taking Olmesartan- amlodipine 20-5 mg daily   Medication Instructions: Take your blood pressure medication at the same time each day. After taking your medication, check your blood pressure at least an hour later. If your first reading is >140/90 mmHg, wait at least 10 minutes and recheck your blood pressure. Side Effects: In the initial days of therapy, you may experience dizziness or lightheadedness as your body adjusts to the lower blood pressure; this is expected. Diet and Lifestyle: Adhere to a low-sodium diet, limiting intake to less than 1500 mg daily, and increase your physical activity. Avoid over-the-counter NSAIDs such as ibuprofen and naproxen while on this medication. Hydration and Nutrition: Stay well-hydrated by drinking at least 64 ounces of water daily. Increase your servings of fruits and vegetables and avoid excessive sodium in your diet. Long-Term Considerations: Uncontrolled hypertension can increase the risk of cardiovascular diseases, including stroke, coronary artery disease, and heart failure.  Please report to the emergency department if your blood pressure exceeds 180/120 and is accompanied by symptoms such as headaches, chest pain, palpitations, blurred vision, or dizziness.   Attached with your AVS, you will find valuable resources for self-education. I highly recommend dedicating some time to thoroughly examine them.   Please continue to a heart-healthy diet and increase your physical activities. Try to exercise for at least five days a week.    It was a pleasure to see you and I look forward to continuing to work together on your health and  well-being. Please do not hesitate to call the office if you need care or have questions about your care.  In case of emergency, please visit the Emergency Department for urgent care, or contact our clinic at [phone] to schedule an appointment. We're here to help you!   Have a wonderful day and week. With Gratitude, Gilmore Laroche MSN, FNP-BC

## 2023-08-05 DIAGNOSIS — E559 Vitamin D deficiency, unspecified: Secondary | ICD-10-CM | POA: Insufficient documentation

## 2023-08-05 NOTE — Assessment & Plan Note (Signed)
 Encouraged to increase his intake of vitamin D-rich foods such as fatty fish (e.g., salmon, mackerel, and sardines), fortified dairy products, egg yolks, and fortified cereals.

## 2023-08-05 NOTE — Assessment & Plan Note (Signed)
 Uncontrolled on Amlodipine 5 mg Daily The patient is asymptomatic. Will initiate therapy with Olmesartan-Amlodipine 20/5 mg daily.  A low-sodium diet of less than 2,300 mg daily is recommended, along with moderate-intensity physical activity for at least 150 minutes per week. The patient is encouraged to maintain these lifestyle modifications to help manage her blood pressure effectively.  Long-term considerations were discussed, emphasizing that uncontrolled hypertension increases the risk of cardiovascular diseases, including stroke, coronary artery disease, and heart failure.  The patient is encouraged to seek emergency care if blood pressure exceeds 180/120 and is accompanied by symptoms such as headaches, chest pain, palpitations, blurred vision, or dizziness. She verbalized understanding and will follow up as scheduled.  BP Readings from Last 3 Encounters:  08/04/23 (!) 142/80  05/27/23 (!) 138/90  04/27/23 (!) 141/115

## 2023-08-10 ENCOUNTER — Telehealth: Payer: Self-pay

## 2023-08-10 NOTE — Telephone Encounter (Signed)
 Patient was identified as falling into the [place] Measure - Diabetes.   Patient was: Appointment scheduled with primary care provider in the next 30 days.

## 2023-09-05 ENCOUNTER — Ambulatory Visit

## 2023-10-08 ENCOUNTER — Emergency Department (HOSPITAL_COMMUNITY)
Admission: EM | Admit: 2023-10-08 | Discharge: 2023-10-08 | Disposition: A | Discharge status: Home / Self Care | Payer: Self-pay | Attending: Emergency Medicine | Admitting: Emergency Medicine

## 2023-10-08 ENCOUNTER — Other Ambulatory Visit: Payer: Self-pay

## 2023-10-08 ENCOUNTER — Encounter (HOSPITAL_COMMUNITY): Payer: Self-pay

## 2023-10-08 ENCOUNTER — Emergency Department (HOSPITAL_COMMUNITY): Payer: Self-pay

## 2023-10-08 DIAGNOSIS — Z79899 Other long term (current) drug therapy: Secondary | ICD-10-CM | POA: Insufficient documentation

## 2023-10-08 DIAGNOSIS — R739 Hyperglycemia, unspecified: Secondary | ICD-10-CM

## 2023-10-08 DIAGNOSIS — I10 Essential (primary) hypertension: Secondary | ICD-10-CM | POA: Insufficient documentation

## 2023-10-08 DIAGNOSIS — E1165 Type 2 diabetes mellitus with hyperglycemia: Secondary | ICD-10-CM | POA: Insufficient documentation

## 2023-10-08 DIAGNOSIS — R109 Unspecified abdominal pain: Secondary | ICD-10-CM | POA: Insufficient documentation

## 2023-10-08 DIAGNOSIS — Q893 Situs inversus: Secondary | ICD-10-CM | POA: Insufficient documentation

## 2023-10-08 LAB — COMPREHENSIVE METABOLIC PANEL WITH GFR
ALT: 40 U/L (ref 0–44)
AST: 38 U/L (ref 15–41)
Albumin: 3.7 g/dL (ref 3.5–5.0)
Alkaline Phosphatase: 101 U/L (ref 38–126)
Anion gap: 8 (ref 5–15)
BUN: 9 mg/dL (ref 6–20)
CO2: 23 mmol/L (ref 22–32)
Calcium: 8.8 mg/dL — ABNORMAL LOW (ref 8.9–10.3)
Chloride: 96 mmol/L — ABNORMAL LOW (ref 98–111)
Creatinine, Ser: 1.17 mg/dL (ref 0.61–1.24)
GFR, Estimated: >60 mL/min (ref >60)
Glucose, Bld: 405 mg/dL — ABNORMAL HIGH (ref 70–99)
Potassium: 4 mmol/L (ref 3.5–5.1)
Sodium: 127 mmol/L — ABNORMAL LOW (ref 135–145)
Total Bilirubin: 1 mg/dL (ref 0.0–1.2)
Total Protein: 7.4 g/dL (ref 6.5–8.1)

## 2023-10-08 LAB — URINALYSIS, ROUTINE W REFLEX MICROSCOPIC
Bacteria, UA: NONE SEEN
Bilirubin Urine: NEGATIVE
Glucose, UA: >=500 mg/dL — AB
Hgb urine dipstick: NEGATIVE
Ketones, ur: NEGATIVE mg/dL
Leukocytes,Ua: NEGATIVE
Nitrite: NEGATIVE
Protein, ur: NEGATIVE mg/dL
Specific Gravity, Urine: 1.027 (ref 1.005–1.030)
pH: 5 (ref 5.0–8.0)

## 2023-10-08 LAB — CBC WITH DIFFERENTIAL/PLATELET
Abs Immature Granulocytes: 0.02 10*3/uL (ref 0.00–0.07)
Basophils Absolute: 0 10*3/uL (ref 0.0–0.1)
Basophils Relative: 0 %
Eosinophils Absolute: 0.3 10*3/uL (ref 0.0–0.5)
Eosinophils Relative: 5 %
HCT: 50.9 % (ref 39.0–52.0)
Hemoglobin: 16.7 g/dL (ref 13.0–17.0)
Immature Granulocytes: 0 %
Lymphocytes Relative: 38 %
Lymphs Abs: 2.5 10*3/uL (ref 0.7–4.0)
MCH: 28.1 pg (ref 26.0–34.0)
MCHC: 32.8 g/dL (ref 30.0–36.0)
MCV: 85.5 fL (ref 80.0–100.0)
Monocytes Absolute: 0.5 10*3/uL (ref 0.1–1.0)
Monocytes Relative: 7 %
Neutro Abs: 3.3 10*3/uL (ref 1.7–7.7)
Neutrophils Relative %: 50 %
Platelets: 168 10*3/uL (ref 150–400)
RBC: 5.95 MIL/uL — ABNORMAL HIGH (ref 4.22–5.81)
RDW: 14 % (ref 11.5–15.5)
WBC: 6.7 10*3/uL (ref 4.0–10.5)
nRBC: 0 % (ref 0.0–0.2)

## 2023-10-08 LAB — LIPASE, BLOOD: Lipase: 97 U/L — ABNORMAL HIGH (ref 11–51)

## 2023-10-08 MED ORDER — LIDOCAINE 5 % EX PTCH
1.0000 | MEDICATED_PATCH | CUTANEOUS | Status: DC
  Administered 2023-10-08: 1 via TRANSDERMAL
  Filled 2023-10-08: qty 1

## 2023-10-08 MED ORDER — METHOCARBAMOL 500 MG PO TABS: 500.0000 mg | ORAL_TABLET | Freq: Two times a day (BID) | ORAL | 0 refills | Status: DC

## 2023-10-08 MED ORDER — METHOCARBAMOL 500 MG PO TABS
500.0000 mg | ORAL_TABLET | Freq: Once | ORAL | Status: AC
Start: 2023-10-08 — End: 2023-10-08
  Administered 2023-10-08: 500 mg via ORAL
  Filled 2023-10-08: qty 1

## 2023-10-08 MED ORDER — LIDOCAINE 5 % EX PTCH: 1.0000 | MEDICATED_PATCH | CUTANEOUS | 0 refills | Status: DC

## 2023-10-08 MED ORDER — SODIUM CHLORIDE 0.9 % IV BOLUS
1000.0000 mL | Freq: Once | INTRAVENOUS | Status: AC
  Administered 2023-10-08: 1000 mL via INTRAVENOUS

## 2023-10-08 NOTE — ED Provider Notes (Signed)
 [place] EMERGENCY DEPARTMENT AT [facility] Provider Note   CSN: [id number] Arrival date & time: 10/08/23  1559     History  Chief Complaint  Patient presents with   Flank Pain    Andrew Ritter is a 33 y.o. male.  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 He states that same began 1 week ago and has been persistent since then. Denies history of similar symptoms previously. No nausea, vomiting, or diarrhea. No urinary symptoms. Has been complaint with his diabetes medications. Does note that he was carrying heavy crates of water bottles up several flights of stairs prior to symptom onset. Denies any mid back pain, leg weakness, or loss of bowel or bladder function or saddle paresthesias.  No fevers or chills.  The history is provided by the patient. No language interpreter was used.  Flank Pain       Home Medications Prior to Admission medications   Medication Sig Start Date End Date Taking? Authorizing Provider  amLODipine  (NORVASC ) 5 MG tablet Take 1 tablet (5 mg total) by mouth daily. 05/30/23   Zarwolo, Gloria, FNP  amLODipine -olmesartan  (AZOR ) 5-20 MG tablet Take 1 tablet by mouth daily. 08/04/23   Zarwolo, Gloria, FNP  Blood Glucose Monitoring Suppl DEVI 1 each by Does not apply route in the morning, at noon, and at bedtime. May substitute to any manufacturer covered by patient's insurance. 05/30/23   Zarwolo, Gloria, FNP  Dulaglutide (TRULICITY) 0.75 MG/0.5ML SOAJ Inject 0.75 mg into the skin once a week. 06/03/23   Zarwolo, Gloria, FNP  empagliflozin  (JARDIANCE ) 10 MG TABS tablet Take 1 tablet (10 mg total) by mouth daily before breakfast. 06/03/23   Zarwolo, Gloria, FNP  gabapentin  (NEURONTIN ) 300 MG capsule Take 1 capsule (300 mg total) by mouth at bedtime. 05/30/23   Zarwolo, Gloria, FNP  glipiZIDE  (GLUCOTROL ) 5 MG tablet Take 1 tablet (5 mg total) by mouth 2 (two) times daily before a meal.  05/30/23   Zarwolo, Gloria, FNP  pantoprazole  (PROTONIX ) 40 MG tablet Take 1 tablet (40 mg total) by mouth 2 (two) times daily. 03/03/23 03/02/24  Vinetta Greening, DO  rosuvastatin  (CRESTOR ) 10 MG tablet Take 1 tablet (10 mg total) by mouth daily. 05/30/23   Zarwolo, Gloria, FNP  Vitamin D , Ergocalciferol , (DRISDOL ) 1.25 MG (50000 UNIT) CAPS capsule Take 1 capsule (50,000 Units total) by mouth every 7 (seven) days. 08/04/23   Zarwolo, Gloria, FNP      Allergies    Shrimp [shellfish allergy] and Other    Review of Systems   Review of Systems  Genitourinary:  Positive for flank pain.  All other systems reviewed and are negative.   Physical Exam Updated Vital Signs BP 137/89 (BP Location: Right Arm)   Pulse 84   Temp 98.1 F (36.7 C) (Oral)   Resp 16   Ht 5\' 7"  (1.702 m)   Wt 118.8 kg   SpO2 97%   BMI 41.04 kg/m  Physical Exam Vitals and nursing note reviewed.  Constitutional:      General: He is not in acute distress.    Appearance: Normal appearance. He is normal weight. He is not ill-appearing, toxic-appearing or diaphoretic.  HENT:     Head: Normocephalic and atraumatic.  Cardiovascular:     Rate and Rhythm: Normal rate.  Pulmonary:     Effort: Pulmonary effort is normal. No respiratory distress.  Abdominal:     General: Abdomen is flat.  Palpations: Abdomen is soft.     Tenderness: There is no abdominal tenderness.     Comments: Right flank TTP without overlying skin changes.   Musculoskeletal:        General: Normal range of motion.     Cervical back: Normal range of motion.     Comments: No midline tenderness to palpation of the cervical, thoracic, or lumbar spine.  Skin:    General: Skin is warm and dry.  Neurological:     General: No focal deficit present.     Mental Status: He is alert.  Psychiatric:        Mood and Affect: Mood normal.        Behavior: Behavior normal.     ED Results / Procedures / Treatments   Labs (all labs ordered are listed, but  only abnormal results are displayed) Labs Reviewed  URINALYSIS, ROUTINE W REFLEX MICROSCOPIC - Abnormal; Notable for the following components:      Result Value   Color, Urine STRAW (*)    Glucose, UA >=500 (*)    All other components within normal limits  COMPREHENSIVE METABOLIC PANEL WITH GFR - Abnormal; Notable for the following components:   Sodium 127 (*)    Chloride 96 (*)    Glucose, Bld 405 (*)    Calcium  8.8 (*)    All other components within normal limits  LIPASE, BLOOD - Abnormal; Notable for the following components:   Lipase 97 (*)    All other components within normal limits  CBC WITH DIFFERENTIAL/PLATELET - Abnormal; Notable for the following components:   RBC 5.95 (*)    All other components within normal limits    EKG None  Radiology CT Renal Stone Study Result Date: 10/08/2023 CLINICAL DATA:  Abdominal pain, flank pain EXAM: CT ABDOMEN AND PELVIS WITHOUT CONTRAST TECHNIQUE: Multidetector CT imaging of the abdomen and pelvis was performed following the standard protocol without IV contrast. RADIATION DOSE REDUCTION: This exam was performed according to the departmental dose-optimization program which includes automated exposure control, adjustment of the mA and/or kV according to patient size and/or use of iterative reconstruction technique. COMPARISON:  03/01/2023 FINDINGS: Lower chest: Situs inversus.  No acute findings. 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 Diffuse low-density throughout the liver compatible with fatty infiltration. No focal abnormality. Gallbladder unremarkable. Pancreas: No focal abnormality or ductal dilatation. Spleen: No focal abnormality.  Normal size. Adrenals/Urinary Tract: No adrenal abnormality. No focal renal abnormality. No stones or hydronephrosis. Urinary bladder is unremarkable. Stomach/Bowel: Normal appendix. Stomach, large and small bowel grossly unremarkable. Vascular/Lymphatic: No evidence of aneurysm or adenopathy.  Reproductive: No visible focal abnormality. Other: No free fluid or free air. Musculoskeletal: No acute bony abnormality. IMPRESSION: No acute findings in the abdomen or pelvis. Hepatic steatosis. Situs inversus Electronically Signed   By: Janeece Mechanic M.D.   On: 10/08/2023 18:44    Procedures Procedures    Medications Ordered in ED Medications  lidocaine (LIDODERM) 5 % 1 patch (1 patch Transdermal Patch Applied 10/08/23 1921)  sodium chloride 0.9 % bolus 1,000 mL (1,000 mLs Intravenous New Bag/Given 10/08/23 1926)  methocarbamol (ROBAXIN) tablet 500 mg (500 mg Oral Given 10/08/23 1921)    ED Course/ Medical Decision Making/ A&P                                 Medical Decision Making Amount and/or Complexity of Data Reviewed Labs:  ordered. Radiology: ordered.  Risk Prescription drug management.   This patient is a 33 y.o. male who presents to the ED for concern of flank pain, this involves an extensive number of treatment options, and is a complaint that carries with it a high risk of complications and morbidity. The emergent differential diagnosis prior to evaluation includes, but is not limited to,  AAA, renal vascular thrombosis, mesenteric ischemia, pyelonephritis, nephrolithiasis, cystitis, biliary colic, pancreatitis, PUD, appendicitis, diverticulitis, bowel obstruction, Testicular torsion, Epididymitis   This is not an exhaustive differential.   Past Medical History / Co-morbidities / Social History:  has a past medical history of Diabetes (HCC), HTN (hypertension), and Right sided heart.  Additional history: Chart reviewed.   Physical Exam: Physical exam performed. The pertinent findings include: R flank TTP without overlying skin changes. No midline back TTP  Lab Tests: I ordered, and personally interpreted labs.  The pertinent results include:  Lipase 97, Na 127, chloride 96, glucose 405. UA noninfectious   Imaging Studies: I ordered imaging studies including CT  renal. I independently visualized and interpreted imaging which showed   No acute findings in the abdomen or pelvis.   Hepatic steatosis.   Situs inversus  I agree with the radiologist interpretation.   Medications: I ordered medication including robaxin, lidocaine patch  for pain. Reevaluation of the patient after these medicines showed that the patient improved. I have reviewed the patients home medicines and have made adjustments as needed.   Disposition: After consideration of the diagnostic results and the patients response to treatment, I feel that emergency department workup does not suggest an emergent condition requiring admission or immediate intervention beyond what has been performed at this time. The plan is: discharge with lidocaine patches, robaxin, close outpatient follow-up and return precautions. Pain likely Msk in nature, improved with above interventions. Doubt AAA. Given fluids due to hyperglycemia. Recommend close pcp follow-up to discussed glucose management. Patient was aware of his situs inversus. Evaluation and diagnostic testing in the emergency department does not suggest an emergent condition requiring admission or immediate intervention beyond what has been performed at this time.  Plan for discharge with close PCP follow-up.  Patient is understanding and amenable with plan, educated on red flag symptoms that would prompt immediate return.  Patient discharged in stable condition.  Final Clinical Impression(s) / ED Diagnoses Final diagnoses:  Right flank pain  Situs inversus  Hyperglycemia    Rx / DC Orders ED Discharge Orders          Ordered    methocarbamol (ROBAXIN) 500 MG tablet  2 times daily        10/08/23 2036    lidocaine (LIDODERM) 5 %  Every 24 hours        10/08/23 2036          An After Visit Summary was printed and given to the patient.     Fredna Jasper 10/08/23 2038    Early Glisson, MD 10/09/23 [phone]

## 2023-10-08 NOTE — Discharge Instructions (Addendum)
 As we discussed, your workup here today was reassuring for acute findings.  Laboratory evaluation and CT imaging did not reveal any obvious cause of your symptoms.  I suspect that is muscular in nature due to the heavy lifting you have been doing.  I have given you a prescription for a muscle relaxer and lidocaine patches you can wear as needed for management of your symptoms.  Do not drive or operate heavy machinery while taking Robaxin as it can be sedating.  Additionally, your blood sugar was elevated today, I strongly suggest that you follow-up closely with your PCP to discuss management of your blood sugars going forward.  Return if development of any new or worsening symptoms.

## 2023-10-08 NOTE — ED Triage Notes (Signed)
 Pt arrive with with pain to right flank that comes and goes X1 week reports today pain has been constant. Denies blood in urine, does report one episode of painful urination.

## 2023-10-27 ENCOUNTER — Ambulatory Visit: Admitting: Family Medicine

## 2023-10-27 ENCOUNTER — Emergency Department (HOSPITAL_COMMUNITY)
Admission: EM | Admit: 2023-10-27 | Discharge: 2023-10-28 | Disposition: A | Discharge status: Home / Self Care | Payer: Self-pay | Attending: Emergency Medicine | Admitting: Emergency Medicine

## 2023-10-27 ENCOUNTER — Encounter (HOSPITAL_COMMUNITY): Payer: Self-pay | Admitting: Emergency Medicine

## 2023-10-27 ENCOUNTER — Other Ambulatory Visit: Payer: Self-pay

## 2023-10-27 DIAGNOSIS — M5442 Lumbago with sciatica, left side: Secondary | ICD-10-CM | POA: Insufficient documentation

## 2023-10-27 DIAGNOSIS — M5441 Lumbago with sciatica, right side: Secondary | ICD-10-CM | POA: Insufficient documentation

## 2023-10-27 NOTE — ED Triage Notes (Signed)
 Pt to ed pov c/o of lower mid back pain that radiates to bilateral legs with numbness and tingling that comes and go. Pt took muscle relaxers when seen last week for the same. Pt states it helped but pain came back.

## 2023-10-28 MED ORDER — METHOCARBAMOL 500 MG PO TABS: 500.0000 mg | ORAL_TABLET | Freq: Three times a day (TID) | ORAL | 0 refills | Status: DC | PRN

## 2023-10-28 MED ORDER — NAPROXEN 500 MG PO TABS: 500.0000 mg | ORAL_TABLET | Freq: Two times a day (BID) | ORAL | 0 refills | Status: DC

## 2023-10-28 MED ORDER — METHYLPREDNISOLONE SODIUM SUCC 125 MG IJ SOLR
125.0000 mg | Freq: Once | INTRAMUSCULAR | Status: AC
  Administered 2023-10-28: 125 mg via INTRAMUSCULAR
  Filled 2023-10-28: qty 2

## 2023-10-28 MED ORDER — KETOROLAC TROMETHAMINE 60 MG/2ML IM SOLN
30.0000 mg | Freq: Once | INTRAMUSCULAR | Status: AC
  Administered 2023-10-28: 30 mg via INTRAMUSCULAR
  Filled 2023-10-28: qty 2

## 2023-10-28 MED ORDER — METHOCARBAMOL 500 MG PO TABS
500.0000 mg | ORAL_TABLET | Freq: Once | ORAL | Status: AC
  Administered 2023-10-28: 500 mg via ORAL
  Filled 2023-10-28: qty 1

## 2023-10-28 NOTE — ED Provider Notes (Signed)
 [place] EMERGENCY DEPARTMENT AT [facility] Provider Note   CSN: [id number] Arrival date & time: 10/27/23  2243     History  Chief Complaint  Patient presents with   Back Pain    Andrew Ritter is a 33 y.o. male.  Here with recurrence of his back pain from a couple weeks ago. Has some radiation towards back of thighs now. Worse 'when sitting around playing games' and with twisting/movement. No GI symptoms. No GU symptoms. No numbness weakness or tingling in legs. No incontinence.    Back Pain      Home Medications Prior to Admission medications   Medication Sig Start Date End Date Taking? Authorizing Provider  methocarbamol  (ROBAXIN ) 500 MG tablet Take 1 tablet (500 mg total) by mouth every 8 (eight) hours as needed for muscle spasms. 10/28/23  Yes Ellisa Devivo, MD  naproxen (NAPROSYN) 500 MG tablet Take 1 tablet (500 mg total) by mouth 2 (two) times daily. 10/28/23  Yes Jaila Schellhorn, Reymundo Caulk, MD  amLODipine  (NORVASC ) 5 MG tablet Take 1 tablet (5 mg total) by mouth daily. 05/30/23   Zarwolo, Gloria, FNP  amLODipine -olmesartan  (AZOR ) 5-20 MG tablet Take 1 tablet by mouth daily. 08/04/23   Zarwolo, Gloria, FNP  Blood Glucose Monitoring Suppl DEVI 1 each by Does not apply route in the morning, at noon, and at bedtime. May substitute to any manufacturer covered by patient's insurance. 05/30/23   Zarwolo, Gloria, FNP  Dulaglutide (TRULICITY) 0.75 MG/0.5ML SOAJ Inject 0.75 mg into the skin once a week. 06/03/23   Zarwolo, Gloria, FNP  empagliflozin  (JARDIANCE ) 10 MG TABS tablet Take 1 tablet (10 mg total) by mouth daily before breakfast. 06/03/23   Zarwolo, Gloria, FNP  gabapentin  (NEURONTIN ) 300 MG capsule Take 1 capsule (300 mg total) by mouth at bedtime. 05/30/23   Zarwolo, Gloria, FNP  glipiZIDE  (GLUCOTROL ) 5 MG tablet Take 1 tablet (5 mg total) by mouth 2 (two) times daily before a meal. 05/30/23   Zarwolo, Gloria, FNP  lidocaine  (LIDODERM ) 5 % Place 1 patch onto the skin daily. Remove &  Discard patch within 12 hours or as directed by MD 10/08/23   Smoot, Genevive Ket, PA-C  pantoprazole  (PROTONIX ) 40 MG tablet Take 1 tablet (40 mg total) by mouth 2 (two) times daily. 03/03/23 03/02/24  Vinetta Greening, DO  rosuvastatin  (CRESTOR ) 10 MG tablet Take 1 tablet (10 mg total) by mouth daily. 05/30/23   Zarwolo, Gloria, FNP  Vitamin D , Ergocalciferol , (DRISDOL ) 1.25 MG (50000 UNIT) CAPS capsule Take 1 capsule (50,000 Units total) by mouth every 7 (seven) days. 08/04/23   Zarwolo, Gloria, FNP      Allergies    Shrimp [shellfish allergy] and Other    Review of Systems   Review of Systems  Musculoskeletal:  Positive for back pain.    Physical Exam Updated Vital Signs BP 126/88   Pulse 94   Temp 99.5 F (37.5 C) (Oral)   Resp 20   Ht 5\' 7"  (1.702 m)   Wt 118.8 kg   SpO2 99%   BMI 41.03 kg/m  Physical Exam Vitals and nursing note reviewed.  Constitutional:      Appearance: He is well-developed.  HENT:     Head: Normocephalic and atraumatic.  Cardiovascular:     Rate and Rhythm: Normal rate.  Pulmonary:     Effort: Pulmonary effort is normal. No respiratory distress.  Abdominal:     General: There is no distension.     Tenderness: There is  no abdominal tenderness.  Musculoskeletal:        General: No tenderness. Normal range of motion.     Cervical back: Normal range of motion.  Neurological:     Mental Status: He is alert.     ED Results / Procedures / Treatments   Labs (all labs ordered are listed, but only abnormal results are displayed) Labs Reviewed - No data to display  EKG None  Radiology No results found.  Procedures Procedures    Medications Ordered in ED Medications  methylPREDNISolone sodium succinate (SOLU-MEDROL) 125 mg/2 mL injection 125 mg (125 mg Intramuscular Given 10/28/23 0112)  ketorolac  (TORADOL ) injection 30 mg (30 mg Intramuscular Given 10/28/23 0112)  methocarbamol  (ROBAXIN ) tablet 500 mg (500 mg Oral Given 10/28/23 0112)    ED  Course/ Medical Decision Making/ A&P                                 Medical Decision Making Risk Prescription drug management.   Likely msk, maybe some component of sciatica. Ct recently with same symptoms but no obvious abnormality. Will fu w/ pcp if not improving in a few days for consideration of MRI.     Final Clinical Impression(s) / ED Diagnoses Final diagnoses:  Acute midline low back pain with bilateral sciatica    Rx / DC Orders ED Discharge Orders          Ordered    naproxen (NAPROSYN) 500 MG tablet  2 times daily        10/28/23 0057    methocarbamol  (ROBAXIN ) 500 MG tablet  Every 8 hours PRN        10/28/23 0057              Sheriff Rodenberg, MD 10/28/23 [phone]

## 2023-11-02 ENCOUNTER — Encounter: Payer: Self-pay | Admitting: Family Medicine

## 2024-04-26 ENCOUNTER — Inpatient Hospital Stay (HOSPITAL_COMMUNITY)
Admission: EM | Admit: 2024-04-26 | Discharge: 2024-04-30 | DRG: 603 | Disposition: A | Payer: MEDICAID | Attending: Internal Medicine | Admitting: Internal Medicine

## 2024-04-26 ENCOUNTER — Other Ambulatory Visit: Payer: Self-pay

## 2024-04-26 ENCOUNTER — Encounter (HOSPITAL_COMMUNITY): Payer: Self-pay

## 2024-04-26 ENCOUNTER — Emergency Department (HOSPITAL_COMMUNITY): Payer: Self-pay

## 2024-04-26 DIAGNOSIS — E785 Hyperlipidemia, unspecified: Secondary | ICD-10-CM

## 2024-04-26 DIAGNOSIS — L0390 Cellulitis, unspecified: Secondary | ICD-10-CM | POA: Diagnosis present

## 2024-04-26 DIAGNOSIS — I10 Essential (primary) hypertension: Secondary | ICD-10-CM | POA: Diagnosis present

## 2024-04-26 DIAGNOSIS — E1165 Type 2 diabetes mellitus with hyperglycemia: Secondary | ICD-10-CM | POA: Diagnosis present

## 2024-04-26 DIAGNOSIS — L03221 Cellulitis of neck: Principal | ICD-10-CM

## 2024-04-26 DIAGNOSIS — E66813 Obesity, class 3: Secondary | ICD-10-CM

## 2024-04-26 LAB — CBC WITH DIFFERENTIAL/PLATELET
Abs Immature Granulocytes: 0.04 K/uL (ref 0.00–0.07)
Basophils Absolute: 0.1 K/uL (ref 0.0–0.1)
Basophils Relative: 0 %
Eosinophils Absolute: 0.1 K/uL (ref 0.0–0.5)
Eosinophils Relative: 1 %
HCT: 52.1 % — ABNORMAL HIGH (ref 39.0–52.0)
Hemoglobin: 17.6 g/dL — ABNORMAL HIGH (ref 13.0–17.0)
Immature Granulocytes: 0 %
Lymphocytes Relative: 27 %
Lymphs Abs: 3 K/uL (ref 0.7–4.0)
MCH: 28.6 pg (ref 26.0–34.0)
MCHC: 33.8 g/dL (ref 30.0–36.0)
MCV: 84.7 fL (ref 80.0–100.0)
Monocytes Absolute: 1 K/uL (ref 0.1–1.0)
Monocytes Relative: 9 %
Neutro Abs: 7 K/uL (ref 1.7–7.7)
Neutrophils Relative %: 63 %
Platelets: 175 K/uL (ref 150–400)
RBC: 6.15 MIL/uL — ABNORMAL HIGH (ref 4.22–5.81)
RDW: 13.4 % (ref 11.5–15.5)
WBC: 11.2 K/uL — ABNORMAL HIGH (ref 4.0–10.5)
nRBC: 0 % (ref 0.0–0.2)

## 2024-04-26 LAB — COMPREHENSIVE METABOLIC PANEL WITH GFR
ALT: 21 U/L (ref 0–44)
AST: 17 U/L (ref 15–41)
Albumin: 4.4 g/dL (ref 3.5–5.0)
Alkaline Phosphatase: 137 U/L — ABNORMAL HIGH (ref 38–126)
Anion gap: 11 (ref 5–15)
BUN: 7 mg/dL (ref 6–20)
CO2: 26 mmol/L (ref 22–32)
Calcium: 9.3 mg/dL (ref 8.9–10.3)
Chloride: 95 mmol/L — ABNORMAL LOW (ref 98–111)
Creatinine, Ser: 1.18 mg/dL (ref 0.61–1.24)
GFR, Estimated: >60 mL/min (ref >60)
Glucose, Bld: 418 mg/dL — ABNORMAL HIGH (ref 70–99)
Potassium: 4.9 mmol/L (ref 3.5–5.1)
Sodium: 131 mmol/L — ABNORMAL LOW (ref 135–145)
Total Bilirubin: 2.2 mg/dL — ABNORMAL HIGH (ref 0.0–1.2)
Total Protein: 8 g/dL (ref 6.5–8.1)

## 2024-04-26 LAB — GLUCOSE, CAPILLARY: Glucose-Capillary: 334 mg/dL — ABNORMAL HIGH (ref 70–99)

## 2024-04-26 LAB — LACTIC ACID, PLASMA: Lactic Acid, Venous: 1.6 mmol/L (ref 0.5–1.9)

## 2024-04-26 MED ORDER — MORPHINE SULFATE (PF) 4 MG/ML IV SOLN
4.0000 mg | Freq: Once | INTRAVENOUS | Status: AC
  Administered 2024-04-26: 4 mg via INTRAVENOUS
  Filled 2024-04-26: qty 1

## 2024-04-26 MED ORDER — ROSUVASTATIN CALCIUM 10 MG PO TABS
10.0000 mg | ORAL_TABLET | Freq: Every day | ORAL | Status: DC
  Administered 2024-04-26 – 2024-04-29 (×4): 10 mg via ORAL
  Filled 2024-04-26 (×4): qty 1

## 2024-04-26 MED ORDER — PANTOPRAZOLE SODIUM 40 MG PO TBEC
40.0000 mg | DELAYED_RELEASE_TABLET | Freq: Two times a day (BID) | ORAL | Status: DC
  Administered 2024-04-26 – 2024-04-30 (×8): 40 mg via ORAL
  Filled 2024-04-26 (×8): qty 1

## 2024-04-26 MED ORDER — LACTATED RINGERS IV SOLN: INTRAVENOUS | Status: AC

## 2024-04-26 MED ORDER — ENOXAPARIN SODIUM 40 MG/0.4ML IJ SOSY: 40.0000 mg | PREFILLED_SYRINGE | INTRAMUSCULAR | Status: DC

## 2024-04-26 MED ORDER — VANCOMYCIN HCL 1500 MG/300ML IV SOLN
1500.0000 mg | Freq: Once | INTRAVENOUS | Status: AC
  Administered 2024-04-26: 1500 mg via INTRAVENOUS
  Filled 2024-04-26: qty 300

## 2024-04-26 MED ORDER — INSULIN ASPART 100 UNIT/ML IJ SOLN
0.0000 [IU] | Freq: Three times a day (TID) | INTRAMUSCULAR | Status: DC
  Administered 2024-04-27 (×3): 15 [IU] via SUBCUTANEOUS
  Administered 2024-04-28: 11 [IU] via SUBCUTANEOUS
  Administered 2024-04-28: 8 [IU] via SUBCUTANEOUS
  Administered 2024-04-28: 11 [IU] via SUBCUTANEOUS
  Administered 2024-04-29: 8 [IU] via SUBCUTANEOUS
  Administered 2024-04-29 (×2): 11 [IU] via SUBCUTANEOUS
  Administered 2024-04-30: 15 [IU] via SUBCUTANEOUS
  Filled 2024-04-26 (×9): qty 1

## 2024-04-26 MED ORDER — OXYCODONE HCL 5 MG PO TABS
5.0000 mg | ORAL_TABLET | ORAL | Status: DC | PRN
  Administered 2024-04-26 – 2024-04-30 (×9): 5 mg via ORAL
  Filled 2024-04-26 (×10): qty 1

## 2024-04-26 MED ORDER — AMLODIPINE BESYLATE 5 MG PO TABS
5.0000 mg | ORAL_TABLET | Freq: Every day | ORAL | Status: DC
  Administered 2024-04-27 – 2024-04-30 (×4): 5 mg via ORAL
  Filled 2024-04-26 (×5): qty 1

## 2024-04-26 MED ORDER — IOHEXOL 300 MG/ML  SOLN
75.0000 mL | Freq: Once | INTRAMUSCULAR | Status: AC | PRN
  Administered 2024-04-26: 75 mL via INTRAVENOUS

## 2024-04-26 MED ORDER — VANCOMYCIN HCL IN DEXTROSE 1-5 GM/200ML-% IV SOLN
1000.0000 mg | Freq: Two times a day (BID) | INTRAVENOUS | Status: DC
  Administered 2024-04-27 – 2024-04-28 (×3): 1000 mg via INTRAVENOUS
  Filled 2024-04-26 (×3): qty 200

## 2024-04-26 MED ORDER — GABAPENTIN 300 MG PO CAPS: 300.0000 mg | ORAL_CAPSULE | Freq: Every evening | ORAL | Status: DC | PRN

## 2024-04-26 MED ORDER — VANCOMYCIN HCL IN DEXTROSE 1-5 GM/200ML-% IV SOLN
1000.0000 mg | Freq: Once | INTRAVENOUS | Status: AC
  Administered 2024-04-26: 1000 mg via INTRAVENOUS
  Filled 2024-04-26: qty 200

## 2024-04-26 MED ORDER — ACETAMINOPHEN 650 MG RE SUPP: 650.0000 mg | Freq: Four times a day (QID) | RECTAL | Status: DC | PRN

## 2024-04-26 MED ORDER — SODIUM CHLORIDE 0.9 % IV SOLN
1.0000 g | Freq: Once | INTRAVENOUS | Status: DC
  Administered 2024-04-26: 1 g via INTRAVENOUS
  Filled 2024-04-26: qty 10

## 2024-04-26 MED ORDER — INSULIN ASPART 100 UNIT/ML IJ SOLN
0.0000 [IU] | Freq: Every day | INTRAMUSCULAR | Status: DC
  Administered 2024-04-26 – 2024-04-27 (×2): 4 [IU] via SUBCUTANEOUS
  Administered 2024-04-28 – 2024-04-29 (×2): 3 [IU] via SUBCUTANEOUS
  Filled 2024-04-26 (×4): qty 1

## 2024-04-26 MED ORDER — SODIUM CHLORIDE 0.9 % IV SOLN
2.0000 g | Freq: Three times a day (TID) | INTRAVENOUS | Status: DC
  Administered 2024-04-26 – 2024-04-30 (×11): 2 g via INTRAVENOUS
  Filled 2024-04-26 (×11): qty 12.5

## 2024-04-26 MED ORDER — ENOXAPARIN SODIUM 60 MG/0.6ML IJ SOSY
0.5000 mg/kg | PREFILLED_SYRINGE | INTRAMUSCULAR | Status: DC
  Administered 2024-04-27 – 2024-04-29 (×3): 60 mg via SUBCUTANEOUS
  Filled 2024-04-26 (×3): qty 0.6

## 2024-04-26 MED ORDER — SODIUM CHLORIDE 0.9 % IV SOLN: 2.0000 g | Freq: Once | INTRAVENOUS | Status: DC

## 2024-04-26 MED ORDER — ASPIRIN 81 MG PO TBEC
81.0000 mg | DELAYED_RELEASE_TABLET | Freq: Every day | ORAL | Status: DC
  Administered 2024-04-26 – 2024-04-30 (×5): 81 mg via ORAL
  Filled 2024-04-26 (×6): qty 1

## 2024-04-26 MED ORDER — ACETAMINOPHEN 325 MG PO TABS: 650.0000 mg | ORAL_TABLET | Freq: Four times a day (QID) | ORAL | Status: DC | PRN

## 2024-04-26 MED ORDER — SALINE SPRAY 0.65 % NA SOLN: 1.0000 | NASAL | Status: DC | PRN

## 2024-04-26 MED ORDER — SODIUM CHLORIDE 0.9 % IV BOLUS
1000.0000 mL | Freq: Once | INTRAVENOUS | Status: AC
  Administered 2024-04-26: 1000 mL via INTRAVENOUS

## 2024-04-26 MED ORDER — HYDRALAZINE HCL 20 MG/ML IJ SOLN: 5.0000 mg | INTRAMUSCULAR | Status: DC | PRN

## 2024-04-26 NOTE — Progress Notes (Signed)
 Pharmacy Antibiotic Note  Andrew Ritter is a 33 y.o. male admitted on 04/26/2024 with cellulitis.  Pharmacy has been consulted for cefepime and vancomycin dosing. -per H&P: insect bite- significant cellulitis, swelling in the posterior neck area, surrounding lymphadenopathy, attending into the muscles area at posterior cervical paraspinal muscles, fortunately there is no abscess or fluid collection.  -Hx Diabetes  Plan: Will order Cefepime 2 gm IV q8h  Vancomycin 1000 mg IV x 1 ordered by MD. Will add Vancomycin 1500 mg IV x 1 for a total loading dose of 2500mg  Will continue with Vancomycin 1000 mg IV Q 12 hrs. Goal AUC 400-550. Expected AUC: 462 Cmin 13.0 SCr used: 1.18 Vd 0.5  BMI 40  Continue to assess renal fxn, cultures, length of therapy, etc   Height: 5' 7 (170.2 cm) Weight: 118.8 kg (261 lb 14.5 oz) IBW/kg (Calculated) : 66.1  Temp (24hrs), Avg:99.1 F (37.3 C), Min:98.6 F (37 C), Max:99.9 F (37.7 C)  Recent Labs  Lab 04/26/24 1603  WBC 11.2*  CREATININE 1.18  LATICACIDVEN 1.6    Estimated Creatinine Clearance: 108.8 mL/min (by C-G formula based on SCr of 1.18 mg/dL).    Allergies  Allergen Reactions   Shrimp [Shellfish Allergy] Anaphylaxis   Citrullus Vulgaris    Cvs Lubricant Eye Drops [Benzalkonium Chloride]    Other     Cats-Face swelling and itching Eye drops for pink eye-eyes became more swollen    Antimicrobials this admission: Ceftriaxone 12/4 x 1 vancomycin 12/4 >>   Cefepime 12/4 >>    Dose adjustments this admission:    Microbiology results: 12/4 BCx: pending   UCx:      Sputum:      MRSA PCR:    Thank you for allowing pharmacy to be a part of this patient's care.  [name] PharmD Clinical Pharmacist 04/26/2024

## 2024-04-26 NOTE — Progress Notes (Signed)
 PHARMACIST - PHYSICIAN COMMUNICATION  CONCERNING:  Enoxaparin (Lovenox) for DVT Prophylaxis    RECOMMENDATION: Patient was prescribed enoxaprin 40mg  q24 hours for VTE prophylaxis.   Filed Weights   04/26/24 1439  Weight: 118.8 kg (261 lb 14.5 oz)    Body mass index is 41.02 kg/m.  Estimated Creatinine Clearance: 108.8 mL/min (by C-G formula based on SCr of 1.18 mg/dL).   Based on [facility] policy patient is candidate for enoxaparin 0.5mg /kg TBW SQ every 24 hours based on BMI being >30.   DESCRIPTION: Pharmacy has adjusted enoxaparin dose per [facility] policy.  Patient is now receiving enoxaparin 60 mg every 24 hours    Estill CHRISTELLA Lutes, PharmD, BCPS Clinical Pharmacist 04/26/2024 8:18 PM

## 2024-04-26 NOTE — H&P (Signed)
 [facility] H&P   Patient Demographics:    Andrew Ritter, is a 33 y.o. male  MRN: [id number]   DOB - [birth year]  Admit Date - 04/26/2024  Outpatient Primary MD for the patient is Bacchus, Meade PEDLAR, FNP  Chief Complaint  Patient presents with   Insect Bite      HPI:    Andrew Ritter  is a 33 y.o. male, with past medical history of hypertension, obesity, diabetes mellitus, hyperlipidemia, patient presents to ED secondary to complaints of posterior neck pain, swelling and edema, reports he felt something bite him on the back of his neck, and over the last 2 days he has been progressive swelling, tingling to the left hand, he tried Tylenol , warm compress with no help, reports worsening pain and fever 101 at work today - In ED CT neck obtained showing significant cellulitis, with stranding into the paraspinal cervical muscles and left trapezius, given he is diabetic [facility] hospital[facility] consulted to admit for IV antibiotics    Review of systems:      A full 10 point Review of Systems was done, except as stated above, all other Review of Systems were negative.   With Past History of the following :    Past Medical History:  Diagnosis Date   Diabetes (HCC)    per patient's report   HTN (hypertension)    Right sided heart       Past Surgical History:  Procedure Laterality Date   HAND SURGERY Right    screws placed      Social History:     Social History   Tobacco Use   Smoking status: Never   Smokeless tobacco: Never  Substance Use Topics   Alcohol use: No        Family History :     Family History  Problem Relation Age of Onset   Hypertension Mother    Hypertension Father       Home Medications:   Prior to Admission medications   Medication Sig Start Date End Date Taking? Authorizing Provider  amLODipine  (NORVASC ) 5 MG tablet Take 1 tablet (5 mg total) by  mouth daily. 05/30/23  Yes Bacchus, Meade PEDLAR, FNP  aspirin EC 81 MG tablet Take 81 mg by mouth daily. Swallow whole.   Yes [provider]  gabapentin  (NEURONTIN ) 300 MG capsule Take 1 capsule (300 mg total) by mouth at bedtime. Patient taking differently: Take 300 mg by mouth at bedtime as needed (for sleep/tremors). 05/30/23  Yes Bacchus, Meade PEDLAR, FNP  glipiZIDE  (GLUCOTROL ) 5 MG tablet Take 1 tablet (5 mg total) by mouth 2 (two) times daily before a meal. 05/30/23  Yes Bacchus, Meade PEDLAR, FNP  metFORMIN (GLUCOPHAGE) 500 MG tablet Take 500 mg by mouth daily with breakfast.   Yes [provider]  naproxen  sodium (ALEVE ) 220 MG tablet Take 220 mg by mouth at bedtime.  Yes [provider]  Pseudoephedrine-APAP-DM (DAYQUIL MULTI-SYMPTOM COLD/FLU PO) Take 1-2 capsules by mouth daily as needed (for cough and congestion symptoms).   Yes [provider]  rosuvastatin  (CRESTOR ) 10 MG tablet Take 1 tablet (10 mg total) by mouth daily. 05/30/23  Yes Bacchus, Gloria Z, FNP  sodium chloride  (OCEAN) 0.65 % SOLN nasal spray Place 1 spray into both nostrils as needed for congestion.   Yes [provider]  pantoprazole  (PROTONIX ) 40 MG tablet Take 1 tablet (40 mg total) by mouth 2 (two) times daily. 03/03/23 03/02/24  Carver, Charles K, DO     Allergies:     Allergies  Allergen Reactions   Shrimp [Shellfish Allergy] Anaphylaxis   Citrullus Vulgaris    Cvs Lubricant Eye Drops [Benzalkonium Chloride]    Other     Cats-Face swelling and itching Eye drops for pink eye-eyes became more swollen     Physical Exam:   Vitals  Blood pressure (!) 180/157, pulse 100, temperature 99.9 F (37.7 C), temperature source Oral, resp. rate 16, height 5' 7 (1.702 m), weight 118.8 kg, SpO2 98%.   1. General Developed male, laying in bed, no apparent distress  2. Normal affect and insight, Not Suicidal or Homicidal, Awake Alert, Oriented X 3.  3. No F.N deficits, ALL C.Nerves  Intact, Strength 5/5 all 4 extremities, Sensation intact all 4 extremities, Plantars down going.  4. Ears and Eyes appear Normal, Conjunctivae clear, PERRLA. Moist Oral Mucosa.  5.  Posterior neck area with significant swelling, radiating to upper shoulder area, tender to palpation, with cervical lymphadenopathy , left > right  6. Symmetrical Chest wall movement, Good air movement bilaterally, CTAB.  7. RRR, dextrocardia  8. Positive Bowel Sounds, Abdomen Soft, No tenderness, No organomegaly appriciated,No rebound -guarding or rigidity.  9.  No Cyanosis, Normal Skin Turgor, No Skin Rash or Bruise.  10. Good muscle tone,  joints appear normal , no effusions, Normal ROM.    Data Review:    CBC Recent Labs  Lab 04/26/24 1603  WBC 11.2*  HGB 17.6*  HCT 52.1*  PLT 175  MCV 84.7  MCH 28.6  MCHC 33.8  RDW 13.4  LYMPHSABS 3.0  MONOABS 1.0  EOSABS 0.1  BASOSABS 0.1   ------------------------------------------------------------------------------------------------------------------  Chemistries  Recent Labs  Lab 04/26/24 1603  NA 131*  K 4.9  CL 95*  CO2 26  GLUCOSE 418*  BUN 7  CREATININE 1.18  CALCIUM  9.3  AST 17  ALT 21  ALKPHOS 137*  BILITOT 2.2*   ------------------------------------------------------------------------------------------------------------------ estimated creatinine clearance is 108.8 mL/min (by C-G formula based on SCr of 1.18 mg/dL). ------------------------------------------------------------------------------------------------------------------ No results for input(s): TSH, T4TOTAL, T3FREE, THYROIDAB in the last 72 hours.  Invalid input(s): FREET3  Coagulation profile No results for input(s): INR, PROTIME in the last 168 hours. ------------------------------------------------------------------------------------------------------------------- No results for input(s): DDIMER in the last 72  hours. -------------------------------------------------------------------------------------------------------------------  Cardiac Enzymes No results for input(s): CKMB, TROPONINI, MYOGLOBIN in the last 168 hours.  Invalid input(s): CK ------------------------------------------------------------------------------------------------------------------ No results found for: BNP   ---------------------------------------------------------------------------------------------------------------  Urinalysis    Component Value Date/Time   COLORURINE STRAW (A) 10/08/2023 1757   APPEARANCEUR CLEAR 10/08/2023 1757   LABSPEC 1.027 10/08/2023 1757   PHURINE 5.0 10/08/2023 1757   GLUCOSEU >=500 (A) 10/08/2023 1757   HGBUR NEGATIVE 10/08/2023 1757   BILIRUBINUR NEGATIVE 10/08/2023 1757   KETONESUR NEGATIVE 10/08/2023 1757   PROTEINUR NEGATIVE 10/08/2023 1757   NITRITE NEGATIVE 10/08/2023 1757   LEUKOCYTESUR NEGATIVE 10/08/2023 1757    ----------------------------------------------------------------------------------------------------------------  Imaging Results:    CT Soft Tissue Neck W Contrast Result Date: 04/26/2024 EXAM: CT NECK WITH CONTRAST 04/26/2024 05:02:29 PM TECHNIQUE: CT of the neck was performed with the administration of 75 mL of iohexol (OMNIPAQUE) 300 MG/ML solution. Multiplanar reformatted images are provided for review. Automated exposure control, iterative reconstruction, and/or weight based adjustment of the mA/kV was utilized to reduce the radiation dose to as low as reasonably achievable. COMPARISON: None available. CLINICAL HISTORY: Soft tissue infection posterior neck. Possible insect bite to the posterior neck 2 days ago. Swelling. Left hand numbness. FINDINGS: AERODIGESTIVE TRACT: No discrete mass. No edema. SALIVARY GLANDS: The parotid and submandibular glands are unremarkable. THYROID: Unremarkable. LYMPH NODES: Borderline to mildly enlarged posterior left  level III/V lymph nodes measuring 9 mm in short axis, likely reactive. SOFT TISSUES: Subcutaneous soft tissue swelling and inflammatory fat stranding throughout the posterior neck primarily left of midline. Inflammation extends around the left trapezius muscle and to the posterior cervical paraspinal muscles. No organized fluid collection or soft tissue gas. BRAIN, ORBITS, SINUSES AND MASTOIDS: Mild mucosal thickening in the maxillary sinuses. Clear mastoid air cells. No acute abnormality. LUNGS AND MEDIASTINUM: No acute abnormality. BONES: Carious left mandibular 1st molar tooth. No focal bone abnormality. IMPRESSION: 1. Soft tissue swelling/inflammation throughout the posterior neck primarily on the left without evidence of abscess. Electronically signed by: Dasie Hamburg MD 04/26/2024 06:32 PM EST RP Workstation: [id number]       Assessment & Plan:    Principal Problem:   Cellulitis Active Problems:   Hypertension   Type 2 diabetes mellitus with hyperglycemia (HCC)   Cellulitis -Likely provoked by insect bite -Patient with significant cellulitis, swelling in the posterior neck area, surrounding lymphadenopathy, attending into the muscles area at posterior cervical paraspinal muscles, fortunately there is no abscess or fluid collection. - Given he is diabetic patient, and significant cellulitis with evidence of extension into and cervical paraspinal muscles, will start on broad-spectrum antibiotic vancomycin and cefepime. - Follow-up blood cultures.  Pretension - Continue with home medications   Diabetes mellitus, type II - Will check A1c. - Metformin hold given he received IV contrast, will start on insulin sliding scale  Obesity class III -Body mass index is 41.02 kg/m.  Hyperlipidemia -continue with home statin      DVT Prophylaxis  Lovenox   AM Labs Ordered, also please review Full Orders  Family Communication: Admission, patients condition and plan of care including  tests being ordered have been discussed with the patient and girlfriend at bedside who indicate understanding and agree with the plan and Code Status.  Code Status full code  Likely DC to home  Consults called: None  Admission status: Inpatient  Time spent in minutes : 70 minutes   Brayton Lye M.D on 04/26/2024 at 8:19 PM   [facility] - Office  [phone]

## 2024-04-26 NOTE — ED Notes (Signed)
 Pt reports a customer said he had a spider on his neck 2 days ago.  Pt also reports he began having N/V today as well.

## 2024-04-26 NOTE — ED Provider Notes (Signed)
 [place] EMERGENCY DEPARTMENT AT [facility] Provider Note   CSN: [id number] Arrival date & time: 04/26/24  1409     Patient presents with: Insect Bite   Andrew Ritter is a 33 y.o. male. Here for reports he felt something bite him on the back of his neck  on the left side 2 days ago while laying down, seeing some swelling sine then and having tingling in the left hand. He has tried warm compresses and tylenol  and ibuprofen , pain rated 10/10, had temp 101 F at work.  She is diabetic and is on metformin and glipizide , did not check his sugar this morning.    HPI     Prior to Admission medications   Medication Sig Start Date End Date Taking? Authorizing Provider  amLODipine  (NORVASC ) 5 MG tablet Take 1 tablet (5 mg total) by mouth daily. 05/30/23   Bacchus, Gloria Z, FNP  amLODipine -olmesartan  (AZOR ) 5-20 MG tablet Take 1 tablet by mouth daily. 08/04/23   Bacchus, Gloria Z, FNP  Blood Glucose Monitoring Suppl DEVI 1 each by Does not apply route in the morning, at noon, and at bedtime. May substitute to any manufacturer covered by patient's insurance. 05/30/23   Bacchus, Meade PEDLAR, FNP  Dulaglutide  (TRULICITY ) 0.75 MG/0.5ML SOAJ Inject 0.75 mg into the skin once a week. 06/03/23   Bacchus, Meade PEDLAR, FNP  empagliflozin  (JARDIANCE ) 10 MG TABS tablet Take 1 tablet (10 mg total) by mouth daily before breakfast. 06/03/23   Bacchus, Gloria Z, FNP  gabapentin  (NEURONTIN ) 300 MG capsule Take 1 capsule (300 mg total) by mouth at bedtime. 05/30/23   Bacchus, Gloria Z, FNP  glipiZIDE  (GLUCOTROL ) 5 MG tablet Take 1 tablet (5 mg total) by mouth 2 (two) times daily before a meal. 05/30/23   Bacchus, Meade PEDLAR, FNP  lidocaine  (LIDODERM ) 5 % Place 1 patch onto the skin daily. Remove & Discard patch within 12 hours or as directed by MD 10/08/23   Smoot, Lauraine LABOR, PA-C  methocarbamol  (ROBAXIN ) 500 MG tablet Take 1 tablet (500 mg total) by mouth every 8 (eight) hours as needed for muscle spasms. 10/28/23    Mesner, Selinda, MD  naproxen  (NAPROSYN ) 500 MG tablet Take 1 tablet (500 mg total) by mouth 2 (two) times daily. 10/28/23   Mesner, Selinda, MD  pantoprazole  (PROTONIX ) 40 MG tablet Take 1 tablet (40 mg total) by mouth 2 (two) times daily. 03/03/23 03/02/24  Cindie Carlin POUR, DO  rosuvastatin  (CRESTOR ) 10 MG tablet Take 1 tablet (10 mg total) by mouth daily. 05/30/23   Bacchus, Meade PEDLAR, FNP  Vitamin D , Ergocalciferol , (DRISDOL ) 1.25 MG (50000 UNIT) CAPS capsule Take 1 capsule (50,000 Units total) by mouth every 7 (seven) days. 08/04/23   Bacchus, Gloria Z, FNP    Allergies: Shrimp [shellfish allergy], Cvs lubricant eye drops [benzalkonium chloride], and Other    Review of Systems  Updated Vital Signs BP (!) 185/144 (BP Location: Right Arm)   Pulse (!) 106   Temp 98.8 F (37.1 C) (Oral)   Resp 18   Ht 5' 7 (1.702 m)   Wt 118.8 kg   SpO2 96%   BMI 41.02 kg/m   Physical Exam Vitals and nursing note reviewed.  Constitutional:      General: He is not in acute distress.    Appearance: He is well-developed.  HENT:     Head: Normocephalic and atraumatic.  Eyes:     Conjunctiva/sclera: Conjunctivae normal.  Cardiovascular:     Rate and  Rhythm: Normal rate and regular rhythm.     Heart sounds: No murmur heard. Pulmonary:     Effort: Pulmonary effort is normal. No respiratory distress.     Breath sounds: Normal breath sounds.  Abdominal:     Palpations: Abdomen is soft.     Tenderness: There is no abdominal tenderness.  Musculoskeletal:        General: No swelling.     Cervical back: Neck supple.  Skin:    General: Skin is warm and dry.     Capillary Refill: Capillary refill takes less than 2 seconds.     Comments: Approximately 8 cm area of tenderness and induration to the posterior neck towards the left.  There is no crepitus or drainage.  There is a central punctum.  There is mild erythema.  There is no crepitus.  Neurological:     Mental Status: He is alert.     Comments:  Strength 5 out of 5 in bilateral upper and lower extremities.  Psychiatric:        Mood and Affect: Mood normal.     (all labs ordered are listed, but only abnormal results are displayed) Labs Reviewed - No data to display  EKG: None  Radiology: No results found.   Procedures   Medications Ordered in the ED - No data to display                                  Medical Decision Making Differential diagnosis list not limited to abscess, cellulitis, epidermal inclusion cyst, sepsis, other  ED course: Patient presents ER with fever and swelling to the back of his neck.  He states this started after he felt something bite him on the back of his neck several days ago.  He does have swelling and induration to the posterior aspect of the neck.  He is having significant pain.  Is given morphine  for pain and on reevaluation his pain is nearly improved.  He was mildly tachycardic and had elevated glucose was given IV fluids.  He was also given IV Rocephin  for cellulitis.  There is no fluctuance.  Given the location and severity I did obtain soft tissue CT scan which showed no organized fluid collection it did show some borderline to mildly enlarged posterior left lymph nodes likely reactive and subcutaneous soft tissue swelling and inflammatory fat stranding throughout posterior neck left of midline and extending to left trapezius muscle and posterior cervical paraspinal muscles.  Given the extent of this and fever and tachycardia I feel patient does need to be admitted for IV antibiotics.  He was agreeable with admission.  I consulted with hospitalist service and spoke with Dr. Sherlon regarding admission.  Amount and/or Complexity of Data Reviewed Labs: ordered. Radiology: ordered.  Risk Prescription drug management. Decision regarding hospitalization.        Final diagnoses:  None    ED Discharge Orders     None          Suellen Sherran DELENA DEVONNA 04/26/24 2330     Suzette Pac, MD 04/27/24 1106

## 2024-04-26 NOTE — ED Triage Notes (Signed)
 Pt arrived via POV c/o possible insect bite to his posterior neck while he was at work 2 days ago. Pt reports swelling and area around the bite has increased in size and is tender to touch. Pt reports swelling is putting pressure on his since causing numbness in his left hand.

## 2024-04-27 DIAGNOSIS — E66813 Obesity, class 3: Secondary | ICD-10-CM

## 2024-04-27 DIAGNOSIS — I10 Essential (primary) hypertension: Secondary | ICD-10-CM

## 2024-04-27 DIAGNOSIS — E1165 Type 2 diabetes mellitus with hyperglycemia: Secondary | ICD-10-CM

## 2024-04-27 LAB — BASIC METABOLIC PANEL WITH GFR
Anion gap: 13 (ref 5–15)
BUN: 9 mg/dL (ref 6–20)
CO2: 22 mmol/L (ref 22–32)
Calcium: 8.5 mg/dL — ABNORMAL LOW (ref 8.9–10.3)
Chloride: 97 mmol/L — ABNORMAL LOW (ref 98–111)
Creatinine, Ser: 1 mg/dL (ref 0.61–1.24)
GFR, Estimated: >60 mL/min (ref >60)
Glucose, Bld: 406 mg/dL — ABNORMAL HIGH (ref 70–99)
Potassium: 3.8 mmol/L (ref 3.5–5.1)
Sodium: 132 mmol/L — ABNORMAL LOW (ref 135–145)

## 2024-04-27 LAB — GLUCOSE, CAPILLARY
Glucose-Capillary: 370 mg/dL — ABNORMAL HIGH (ref 70–99)
Glucose-Capillary: 518 mg/dL (ref 70–99)
Glucose-Capillary: 336 mg/dL — ABNORMAL HIGH (ref 70–99)
Glucose-Capillary: 431 mg/dL — ABNORMAL HIGH (ref 70–99)
Glucose-Capillary: 313 mg/dL — ABNORMAL HIGH (ref 70–99)

## 2024-04-27 LAB — CBC
HCT: 49.7 % (ref 39.0–52.0)
Hemoglobin: 16.7 g/dL (ref 13.0–17.0)
MCH: 28.8 pg (ref 26.0–34.0)
MCHC: 33.6 g/dL (ref 30.0–36.0)
MCV: 85.7 fL (ref 80.0–100.0)
Platelets: 173 K/uL (ref 150–400)
RBC: 5.8 MIL/uL (ref 4.22–5.81)
RDW: 13.4 % (ref 11.5–15.5)
WBC: 13.5 K/uL — ABNORMAL HIGH (ref 4.0–10.5)
nRBC: 0 % (ref 0.0–0.2)

## 2024-04-27 MED ORDER — INSULIN ASPART 100 UNIT/ML IJ SOLN
5.0000 [IU] | Freq: Three times a day (TID) | INTRAMUSCULAR | Status: DC
  Administered 2024-04-27 – 2024-04-28 (×5): 5 [IU] via SUBCUTANEOUS
  Filled 2024-04-27 (×5): qty 1

## 2024-04-27 MED ORDER — INSULIN GLARGINE-YFGN 100 UNIT/ML ~~LOC~~ SOLN
15.0000 [IU] | Freq: Every day | SUBCUTANEOUS | Status: DC
  Administered 2024-04-27 – 2024-04-28 (×2): 15 [IU] via SUBCUTANEOUS
  Filled 2024-04-27 (×3): qty 0.15

## 2024-04-27 MED ORDER — IBUPROFEN 600 MG PO TABS
600.0000 mg | ORAL_TABLET | Freq: Three times a day (TID) | ORAL | Status: AC
  Administered 2024-04-27 – 2024-04-28 (×6): 600 mg via ORAL
  Filled 2024-04-27 (×6): qty 1

## 2024-04-27 MED ORDER — INSULIN STARTER KIT- PEN NEEDLES (ENGLISH)
1.0000 | Freq: Once | Status: DC
  Filled 2024-04-27: qty 1

## 2024-04-27 MED ORDER — LIVING WELL WITH DIABETES BOOK: Freq: Once | Status: AC

## 2024-04-27 MED ORDER — METHOCARBAMOL 500 MG PO TABS
500.0000 mg | ORAL_TABLET | Freq: Three times a day (TID) | ORAL | Status: DC
  Administered 2024-04-27 – 2024-04-30 (×10): 500 mg via ORAL
  Filled 2024-04-27 (×10): qty 1

## 2024-04-27 NOTE — Discharge Instructions (Addendum)
 Providers Accepting New Patients in [place], [place]    [facility] [address]  [place], [place] [address]J [phone] Accepts most insurances  [facility] [address] [place], [place] [address] [phone] Accepts most insurances  [facility] [address] [place], [place] [address]  [phone] Must meet requirements  [facility] [address] [place], [place] [address] [phone] Accepts most insurances  [facility] [address]  [place], [place] [address] [phone] Accepts most insurances  [facility] [address]   [place], [place]   [phone] Accepts most insurances  [facility])  [phone] [address]  [place], [place] [address] [phone] Accepts most insurances     [facility] [address]  [place], [place] [address] [phone] Accepts most insurances  [facility] [address] [place], [place] [address] [phone] option 1 Accepts Medicaid and [facility] [address]  [place], [place] [address] [phone] Accepts most insurances  Benita Outhouse, MD [address] [place], [place] [address] [phone] Accepts most insurances  [facility] [address]  [place], [place] [address] [phone] Accepts most insurances  [facility] [phone] [place], [place] [address] [phone] Accepts most insurances  [place] [place] [address], [address] [place], [place] [address] [phone]  Accepts most insurances   Carbohydrate Counting For People With Diabetes  Foods with carbohydrates make your blood glucose level go up. Learning how to count carbohydrates can help you control your blood glucose levels. First, identify the foods you eat that contain carbohydrates. Then,  using the Foods with Carbohydrates chart, determine about how much carbohydrates are in your meals and snacks. Make sure you are eating foods with fiber, protein, and healthy fat along with your carbohydrate foods.  Foods with Carbohydrates The following table shows carbohydrate foods that have about 15 grams of carbohydrate each. Using measuring cups, spoons, or a food scale when you first begin learning about carbohydrate counting can help you learn about the portion sizes you typically eat.  The following foods have 15 grams carbohydrate each:  Grains 1 slice bread (1 ounce)  1 small tortilla (6-inch size)   large bagel (1 ounce)  1/3 cup pasta or rice (cooked)   hamburger or hot dog bun ( ounce)   cup cooked cereal   to  cup ready-to-eat cereal  2 taco shells (5-inch size) Fruit 1 small fresh fruit ( to 1 cup)   medium banana  17 small grapes (3 ounces)  1 cup melon or berries   cup canned or frozen fruit  2 tablespoons dried fruit (blueberries, cherries, cranberries, raisins)   cup unsweetened fruit juice  Starchy Vegetables  cup cooked beans, peas, corn, potatoes/sweet potatoes   large baked potato (3 ounces)  1 cup acorn or butternut squash  Snack Foods 3 to 6 crackers  8 potato chips or 13 tortilla chips ( ounce to 1 ounce)  3 cups popped popcorn  Dairy 3/4 cup (6 ounces) nonfat plain yogurt, or yogurt with sugar-free sweetener  1 cup milk  1 cup plain rice, soy, coconut or flavored almond milk Sweets and Desserts  cup ice cream or frozen yogurt  1 tablespoon jam, jelly, pancake syrup, table sugar, or honey  2 tablespoons light pancake syrup  1 inch square of frosted cake  or 2 inch square of unfrosted cake  2 small cookies (2/3 ounce each) or  large cookie   Sometimes you'll have to estimate carbohydrate amounts if you don't know the exact recipe. One cup of mixed foods like soups can have 1 to 2 carbohydrate servings, while some casseroles might have 2  or more servings of carbohydrate. Foods that have less than 20 calories in each serving can be counted as "free" foods. Count 1 cup raw vegetables, or  cup cooked non-starchy vegetables as "free" foods. If you eat 3 or more servings at one meal, then count them as 1 carbohydrate serving.   Foods without Carbohydrates  Not all foods contain carbohydrates. Meat, some dairy, fats, non-starchy vegetables, and many beverages don't contain carbohydrate. So when you count carbohydrates, you can generally exclude chicken, pork, beef, fish, seafood, eggs, tofu, cheese, butter, sour cream, avocado, nuts, seeds, olives, mayonnaise, water, black coffee, unsweetened tea, and zero-calorie drinks. Vegetables with no or low carbohydrate include green beans, cauliflower, tomatoes, and onions.  How much carbohydrate should I eat at each meal?  Carbohydrate counting can help you plan your meals and manage your weight. Following are some starting points for carbohydrate intake at each meal. Work with your registered dietitian nutritionist to find the best range that works for your blood glucose and weight.    To Lose Weight To Maintain Weight  Women 2 - 3 carb servings 3 - 4 carb servings  Men 3 - 4 carb servings 4 - 5 carb servings   Checking your blood glucose after meals will help you know if you need to adjust the timing, type, or number of carbohydrate servings in your meal plan. Achieve and keep a healthy body weight by balancing your food intake and physical activity.  Tips How should I plan my meals?  Plan for half the food on your plate to include non-starchy vegetables, like salad greens, broccoli, or carrots. Try to eat 3 to 5 servings of non-starchy vegetables every day. Have a protein food at each meal. Protein foods include chicken, fish, meat, eggs, or beans (note that beans contain carbohydrate). These two food groups (non-starchy vegetables and proteins) are low in carbohydrate. If you fill up your  plate with these foods, you will eat less carbohydrate but still fill up your stomach. Try to limit your carbohydrate portion to  of the plate.   What fats are healthiest to eat?  Diabetes increases risk for heart disease. To help protect your heart, eat more healthy fats, such as olive oil, nuts, and avocado. Eat less saturated fats like butter, cream, and high-fat meats, like bacon and sausage. Avoid trans fats, which are in all foods that list "partially hydrogenated oil" as an ingredient.  What should I drink?  Choose drinks that are not sweetened with sugar. The healthiest choices are water, carbonated or seltzer waters, and tea and coffee without added sugars.  Sweet drinks will make your blood glucose go up very quickly. One serving of soda or energy drink is  cup. It is best to drink these beverages only if your blood glucose is low.  Artificially sweetened, or diet drinks, typically do not increase your blood glucose if they have zero calories in them. Read labels of beverages, as some diet drinks do have carbohydrate and will raise your blood glucose.  Label Reading Tips Read Nutrition Facts labels to find out how many grams of carbohydrate are in a food you want to eat. Don't forget:  sometimes serving sizes on the label aren't the same as how much food you are going to eat, so you may need to calculate how much carbohydrate is in the food you are serving yourself.   Carbohydrate Counting for People with Diabetes Sample 1-Day Menu  Breakfast  cup yogurt, low fat, low sugar (1 carbohydrate serving)   cup cereal, ready-to-eat, unsweetened (1 carbohydrate serving)  1 cup strawberries (1 carbohydrate serving)   cup almonds ( carbohydrate serving)  Lunch 1, 5 ounce can chunk light tuna  2 ounces cheese, low fat cheddar  6 whole wheat crackers (1 carbohydrate serving)  1 small apple (1 carbohydrate servings)   cup carrots ( carbohydrate serving)   cup snap peas  1 cup 1% milk (1  carbohydrate serving)   Evening Meal Stir fry made with: 3 ounces chicken  1 cup brown rice (3 carbohydrate servings)   cup broccoli ( carbohydrate serving)   cup green beans   cup onions  1 tablespoon olive oil  2 tablespoons teriyaki sauce ( carbohydrate serving)  Evening Snack 1 extra small banana (1 carbohydrate serving)  1 tablespoon peanut butter    Copyright 2020  Academy of Nutrition and Dietetics. All rights reserved.  Using Nutrition Labels: Carbohydrate  Serving Size  Look at the serving size. All the information on the label is based on this portion. Servings Per Container  The number of servings contained in the package. Guidelines for Carbohydrate  Look at the total grams of carbohydrate in the serving size.  1 carbohydrate choice = 15 grams of carbohydrate. Range of Carbohydrate Grams Per Choice  Carbohydrate Grams/Choice Carbohydrate Choices  6-10   11-20 1  21-25 1  26-35 2  36-40 2  41-50 3  51-55 3  56-65 4  66-70 4  71-80 5    Copyright 2020  Academy of Nutrition and Dietetics. All rights reserved.  Plate Method for Diabetes   Foods with carbohydrates make your blood glucose level go up. The plate method is a simple way to meal plan and control the amount of carbohydrate you eat.         Use the following guidance to build a healthy plate to control carbohydrates. Divide a 9-inch plate into 3 sections, and consider your beverage the 4th section of your meal: Food Group Examples of Foods/Beverages for This Section of your Meal  Section 1: Non-starchy vegetables Fill  of your plate to include non-starchy vegetables Asparagus, broccoli, brussels sprouts, cabbage, carrots, cauliflower, celery, cucumber, green beans, mushrooms, peppers, salad greens, tomatoes, or zucchini.  Section 2: Protein foods Fill  of your plate to include a lean protein Lean meat, poultry, fish, seafood, cheese, eggs, lean deli meat, tofu, beans, lentils, nuts or  nut butters.  Section 3: Carbohydrate foods Fill  of your plate to include carbohydrate foods Whole grains, whole wheat bread, brown rice, whole grain pasta, polenta, corn tortillas, fruit, or starchy vegetables (potatoes, green peas, corn, beans, acorn squash, and butternut squash). One cup of milk also counts as a food that contains carbohydrate.  Section 4: Beverage Choose water or a low-calorie drink for your beverage. Unsweetened tea, coffee, or flavored/sparkling water without added sugar.  Image reprinted with permission from The American Diabetes Association.  Copyright 2022 by the American Diabetes Association.   Copyright 2022  Academy of Nutrition and Dietetics. All rights reserved

## 2024-04-27 NOTE — Progress Notes (Signed)
 PROGRESS NOTE    Andrew Ritter  Andrew Ritter:Andrew Ritter DOB: 07-Feb-1991 DOA: 04/26/2024 PCP: Andrew Ritter PEDLAR, FNP    Chief Complaint  Patient presents with    Insect Bite Cellulitis     Brief Narrative:  As per H&P written by Dr. Sherlon on 04/26/2024 Andrew Ritter  is a 33 y.o. male, with past medical history of hypertension, obesity, diabetes mellitus, hyperlipidemia, patient presents to ED secondary to complaints of posterior neck pain, swelling and edema, reports he felt something bite him on the back of his neck, and over the last 2 days he has been progressive swelling, tingling to the left hand, he tried Tylenol , warm compress with no help, reports worsening pain and fever 101 at work today - In ED CT neck obtained showing significant cellulitis, with stranding into the paraspinal cervical muscles and left trapezius, given he is diabetic [facility] consulted to admit for IV antibiotics  Assessment & Plan: 1-cellulitis - Continue IV antibiotics, adequate hydration, analgesics and supportive care. - NSAIDs to assist with anti-inflammatory process will be provided (ibuprofen  600 mg 3 times a day). - Follow clinical response.  2-hypertension - Continue current antihypertensive agents and follow vital signs. - Heart healthy/low-sodium diet discussed with patient.  3-type 2 diabetes mellitus with hyperglycemia - Follow A1c results - Patient CBGs demonstrating uncontrolled diabetes - Continue sliding scale insulin ; adding daily long-acting and NovoLog  meal coverage. - Follow CBG fluctuation.  4-hyperlipidemia - Continue statin.  5-morbid obesity -Body mass index is 41.02 kg/m.  - Low-calorie diet, portion control and increase physical activity discussed with patient.  6-GERD/GI prophylaxis - Continue PPI.     DVT prophylaxis: Lovenox  Code Status: Full code. Family Communication: Significant other present at bedside. Disposition:   Status is:  Inpatient Remains inpatient appropriate because: IV antibiotics.   Consultants:  None  Procedures: See below for x-ray reports.  Antimicrobials: Vancomycin  and cefepime .   Subjective: Low-grade temperature appreciated overnight; no chest pain, no nausea, no vomiting.  Reports still having some numbness/tingling sensation in his upper extremities and fingertips.  Patient expresses pain and swelling sensation in the posterior aspect of his neck and upper back.  Objective: Vitals:   04/26/24 1956 04/26/24 2017 04/26/24 2333 04/27/24 0556  BP: (!) 150/100 (!) 180/157 133/85 137/84  Pulse: 88 100 (!) 110 (!) 111  Resp: 16  20 20   Temp: 98.6 F (37 C) 99.9 F (37.7 C) 98.6 F (37 C) 99.8 F (37.7 C)  TempSrc: Oral Oral Oral Oral  SpO2: 97% 98% 95% 95%  Weight:      Height:        Intake/Output Summary (Last 24 hours) at 04/27/2024 1259 Last data filed at 04/27/2024 1102 Gross per 24 hour  Intake 1003.75 ml  Output --  Net 1003.75 ml   Filed Weights   04/26/24 1439  Weight: 118.8 kg    Examination:  General exam: Appears calm and in no major distress; reports still ongoing pain in the back part of his neck/upper back.  Respiratory system: Good saturation on room air. Cardiovascular system: S1 & S2 heard, RRR.  No rubs, no gallops, no JVD. Gastrointestinal system: Abdomen is obese, nondistended, soft and nontender. No organomegaly or masses felt. Normal bowel sounds heard. Central nervous system: Alert and oriented. No focal neurological deficits. Extremities: Symmetric 5 x 5 power.  Patient reports some intermittent numbness/tingling sensation in his upper extremities. Skin: No petechiae. Psychiatry: Judgement and insight appear normal. Mood & affect appropriate.  Data Reviewed: I have personally reviewed following labs and imaging studies  CBC: Recent Labs  Lab 04/26/24 1603 04/27/24 0426  WBC 11.2* 13.5*  NEUTROABS 7.0  --   HGB 17.6* 16.7  HCT 52.1* 49.7   MCV 84.7 85.7  PLT 175 173    Basic Metabolic Panel: Recent Labs  Lab 04/26/24 1603 04/27/24 0426  NA 131* 132*  K 4.9 3.8  CL 95* 97*  CO2 26 22  GLUCOSE 418* 406*  BUN 7 9  CREATININE 1.18 1.00  CALCIUM  9.3 8.5*    GFR: Estimated Creatinine Clearance: 128.4 mL/min (by C-G formula based on SCr of 1 mg/dL).  Liver Function Tests: Recent Labs  Lab 04/26/24 1603  AST 17  ALT 21  ALKPHOS 137*  BILITOT 2.2*  PROT 8.0  ALBUMIN 4.4    CBG: Recent Labs  Lab 04/26/24 2206 04/27/24 0723 04/27/24 1129  GLUCAP 334* 370* 518*     Recent Results (from the past 240 hours)  Blood culture (routine x 2)     Status: None (Preliminary result)   Collection Time: 04/26/24  4:03 PM   Specimen: BLOOD  Result Value Ref Range Status   Specimen Description BLOOD BLOOD LEFT ARM  Final   Special Requests   Final    BOTTLES DRAWN AEROBIC AND ANAEROBIC Blood Culture results may not be optimal due to an inadequate volume of blood received in culture bottles   Culture   Final    NO GROWTH < 24 HOURS Performed at [facility], [address] [place], [place] [address]    Report Status PENDING  Incomplete  Blood culture (routine x 2)     Status: None (Preliminary result)   Collection Time: 04/26/24  4:25 PM   Specimen: BLOOD  Result Value Ref Range Status   Specimen Description BLOOD RIGHT ANTECUBITAL  Final   Special Requests   Final    BOTTLES DRAWN AEROBIC AND ANAEROBIC Blood Culture adequate volume   Culture   Final    NO GROWTH < 24 HOURS Performed at [facility], [address] [place], [place] [address]    Report Status PENDING  Incomplete    Radiology Studies: CT Soft Tissue Neck W Contrast Result Date: 04/26/2024 EXAM: CT NECK WITH CONTRAST 04/26/2024 05:02:29 PM TECHNIQUE: CT of the neck was performed with the administration of 75 mL of iohexol  (OMNIPAQUE ) 300 MG/ML solution. Multiplanar reformatted images are provided for review. Automated exposure control,  iterative reconstruction, and/or weight based adjustment of the mA/kV was utilized to reduce the radiation dose to as low as reasonably achievable. COMPARISON: None available. CLINICAL HISTORY: Soft tissue infection posterior neck. Possible insect bite to the posterior neck 2 days ago. Swelling. Left hand numbness. FINDINGS: AERODIGESTIVE TRACT: No discrete mass. No edema. SALIVARY GLANDS: The parotid and submandibular glands are unremarkable. THYROID: Unremarkable. LYMPH NODES: Borderline to mildly enlarged posterior left level III/V lymph nodes measuring 9 mm in short axis, likely reactive. SOFT TISSUES: Subcutaneous soft tissue swelling and inflammatory fat stranding throughout the posterior neck primarily left of midline. Inflammation extends around the left trapezius muscle and to the posterior cervical paraspinal muscles. No organized fluid collection or soft tissue gas. BRAIN, ORBITS, SINUSES AND MASTOIDS: Mild mucosal thickening in the maxillary sinuses. Clear mastoid air cells. No acute abnormality. LUNGS AND MEDIASTINUM: No acute abnormality. BONES: Carious left mandibular 1st molar tooth. No focal bone abnormality. IMPRESSION: 1. Soft tissue swelling/inflammation throughout the posterior neck primarily on the left without evidence of  abscess. Electronically signed by: Dasie Hamburg MD 04/26/2024 06:32 PM EST RP Workstation: [id number]    Scheduled Meds:  amLODipine   5 mg Oral Daily   aspirin  EC  81 mg Oral Daily   enoxaparin  (LOVENOX ) injection  0.5 mg/kg Subcutaneous Q24H   ibuprofen   600 mg Oral TID   insulin  aspart  0-15 Units Subcutaneous TID WC   insulin  aspart  0-5 Units Subcutaneous QHS   insulin  aspart  5 Units Subcutaneous TID WC   insulin  glargine-yfgn  15 Units Subcutaneous Daily   methocarbamol   500 mg Oral TID   pantoprazole   40 mg Oral BID   rosuvastatin   10 mg Oral QHS   Continuous Infusions:  ceFEPime  (MAXIPIME ) IV 2 g (04/27/24 0523)   lactated ringers  50 mL/hr at 04/27/24  1102   vancomycin  1,000 mg (04/27/24 0948)     LOS: 1 day    Time spent: 50 minutes    Eric Nunnery, MD [facility]   To contact the attending provider between 7A-7P or the covering provider during after hours 7P-7A, please log into the web site [email or web address] and access using universal [place] password for that web site. If you do not have the password, please call the hospital operator.  04/27/2024, 12:59 PM

## 2024-04-27 NOTE — Plan of Care (Signed)

## 2024-04-27 NOTE — Progress Notes (Addendum)
 Patient has PCP listed, TOC consulted for New PCP list. Added to AVS.     04/27/24 0913  TOC Brief Assessment  Insurance and Status Lapsed  Patient has primary care physician Yes  Home environment has been reviewed Home with family  Prior level of function: Needs assistance -  Prior/Current Home Services No current home services  Social Drivers of Health Review SDOH reviewed no interventions necessary  Readmission risk has been reviewed Yes  Transition of care needs no transition of care needs at this time   Inpatient Care Manager (ICM) has reviewed patient and no ICM needs have been identified at this time. We will continue to monitor patient advancement through interdisciplinary progression rounds. If new patient transition needs arise, please place a ICM consult.

## 2024-04-27 NOTE — Progress Notes (Addendum)
 Nutrition Consult   RD consulted for nutrition education regarding diabetes.   Lab Results  Component Value Date   HGBA1C 11.5 (H) 05/27/2023    RD provided Carbohydrate Counting for People with Diabetes and Plate Method for Diabetes handouts from the Academy of Nutrition and Dietetics. Added handouts to discharge instructions.   Body mass index is 41.02 kg/m. Pt meets criteria for obesity, class 3 based on current BMI.  Current diet order is heart healthy carb modified, patient is consuming approximately 100% of meals at this time. Labs and medications reviewed. Referral sent for outpatient diet education. No further nutrition interventions warranted at this time. RD contact information provided. If additional nutrition issues arise, please re-consult RD.  Suzen HUNT RD, LDN, CNSC Contact via secure chat. If unavailable, use group chat RD Inpatient.

## 2024-04-27 NOTE — Inpatient Diabetes Management (Signed)
 Inpatient Diabetes Program Recommendations  AACE/ADA: New Consensus Statement on Inpatient Glycemic Control (2015)  Target Ranges:  Prepandial:   less than 140 mg/dL      Peak postprandial:   less than 180 mg/dL (1-2 hours)      Critically ill patients:  140 - 180 mg/dL   Lab Results  Component Value Date   GLUCAP 518 (HH) 04/27/2024   HGBA1C 11.5 (H) 05/27/2023    Review of Glycemic Control  Latest Reference Range & Units 04/26/24 22:06 04/27/24 07:23 04/27/24 11:29  Glucose-Capillary 70 - 99 mg/dL 665 (H) 629 (H) 481 (HH)  (HH): Data is critically high (H): Data is abnormally high Diabetes history: Type 2 DM Outpatient Diabetes medications: Metformin 500 mg every day, Glipizide  5 mg BID Current orders for Inpatient glycemic control: Semglee  15 units every day, Novolog  5 units TID, Novolog  0-15 units TID & HS  Inpatient Diabetes Program Recommendations:    Spoke with patient regarding outpatient diabetes management. Admits to polyuria and polydipsia. Was lost to DM care due to outpatient provider struggling to get medications approved with insurance; now lost insurance. Reviewed patient's previous A1c of 11.5%; current lab pending. Explained what a A1c is and what it measures. Also reviewed goal A1c with patient, importance of good glucose control @ home, and blood sugar goals. Reviewed patho of DM, role of insulin , role of pancreas, impact of infection from poor glycemic control, hyper vs hypo glycemia, Relion products, 70/30, interventions, vascular changes and commorbidities.  Patient has a meter. Reports using meter occasionally. Reviewed recommended frequency for checking and when to call PCP. Patient plan to make an appointment.  Denies drinking sugary beverages. Reviewed alternatives, importance of protein, plate method and overall CHO mindfulness. Dietitian consult placed.  Additionally, ordered TOC to assist with follow up, LWWDM and insulin  starter kit.  Educated patient on  insulin  pen use at home. Reviewed contents of insulin  flexpen starter kit. Reviewed all steps of insulin  pen including attachment of needle, 2-unit air shot, dialing up dose, giving injection, removing needle, disposal of sharps, storage of unused insulin , disposal of insulin  etc. Patient will begin working with RN at bedside to learn more about injections. Patient wants time to reflect on conversation and research. Also reviewed troubleshooting with insulin  pen. MD to give patient Rxs for insulin  pens and insulin  pen needles.  Thanks,  Tinnie Minus, MSN, RNC-OB Diabetes Coordinator [phone] (8a-5p)

## 2024-04-27 NOTE — TOC CM/SW Note (Signed)
 Transition of Care [facility]) - Inpatient Brief Assessment   Patient Details  Name: Andrew Ritter MRN: [id number] Date of Birth: May 18, 1991  [facility] of [facility]) CM/SW Contact:    Lucie Lunger, LCSWA Phone Number: 04/27/2024, 11:02 AM   Clinical Narrative: Transition of Care Department [facility]) has reviewed patient and no TOC needs have been identified at this time. We will continue to monitor patient advancement through interdiciplinary progression rounds. If new patient transition needs arise, please place a TOC consult.  Transition of Care Asessment: Insurance and Status: Selfpay Patient has primary care physician: Yes Home environment has been reviewed: Home Prior level of function:: Independent Prior/Current Home Services: No current home services Social Drivers of Health Review: SDOH reviewed no interventions necessary Readmission risk has been reviewed: Yes Transition of care needs: no transition of care needs at this time

## 2024-04-28 LAB — BASIC METABOLIC PANEL WITH GFR
Anion gap: 10 (ref 5–15)
BUN: 8 mg/dL (ref 6–20)
CO2: 25 mmol/L (ref 22–32)
Calcium: 8.6 mg/dL — ABNORMAL LOW (ref 8.9–10.3)
Chloride: 98 mmol/L (ref 98–111)
Creatinine, Ser: 0.88 mg/dL (ref 0.61–1.24)
GFR, Estimated: >60 mL/min (ref >60)
Glucose, Bld: 293 mg/dL — ABNORMAL HIGH (ref 70–99)
Potassium: 3.4 mmol/L — ABNORMAL LOW (ref 3.5–5.1)
Sodium: 133 mmol/L — ABNORMAL LOW (ref 135–145)

## 2024-04-28 LAB — GLUCOSE, CAPILLARY
Glucose-Capillary: 290 mg/dL — ABNORMAL HIGH (ref 70–99)
Glucose-Capillary: 324 mg/dL — ABNORMAL HIGH (ref 70–99)
Glucose-Capillary: 296 mg/dL — ABNORMAL HIGH (ref 70–99)

## 2024-04-28 MED ORDER — INSULIN GLARGINE-YFGN 100 UNIT/ML ~~LOC~~ SOLN
12.0000 [IU] | Freq: Two times a day (BID) | SUBCUTANEOUS | Status: DC
  Administered 2024-04-28: 12 [IU] via SUBCUTANEOUS
  Filled 2024-04-28 (×4): qty 0.12

## 2024-04-28 MED ORDER — VANCOMYCIN HCL 1500 MG/300ML IV SOLN
1500.0000 mg | Freq: Two times a day (BID) | INTRAVENOUS | Status: DC
  Administered 2024-04-28 – 2024-04-30 (×4): 1500 mg via INTRAVENOUS
  Filled 2024-04-28 (×4): qty 300

## 2024-04-28 NOTE — Inpatient Diabetes Management (Addendum)
 Inpatient Diabetes Program Recommendations  AACE/ADA: New Consensus Statement on Inpatient Glycemic Control (2015)  Target Ranges:  Prepandial:   less than 140 mg/dL      Peak postprandial:   less than 180 mg/dL (1-2 hours)      Critically ill patients:  140 - 180 mg/dL    Latest Reference Range & Units 04/27/24 07:23 04/27/24 11:29 04/27/24 14:01 04/27/24 16:37 04/27/24 21:11  Glucose-Capillary 70 - 99 mg/dL 629 (H)  15 units Novolog   15 units Semglee   518 (HH)  20 units Novolog   336 (H) 431 (H)  20 units Novolog   313 (H)  4 units Novolog    (HH): Data is critically high (H): Data is abnormally high  Latest Reference Range & Units 04/28/24 07:58  Glucose-Capillary 70 - 99 mg/dL 709 (H)  13 units Novolog   15 units Semglee   (H): Data is abnormally high   Admit with: Insect Bite/ Cellulitis  History: DM2  Home DM Meds: Metformin  500 mg daily        Glipizide  5 mg BID  Current Orders: Semglee  15 units daily     Novolog  Moderate Correction Scale/ SSI (0-15 units) TID AC + HS     Novolog  5 units TID with meals    MD- Note AM CBG 290 today  Please consider:  1. Increase Semglee  to 25 units daily (~0.2 units/kg)  2. Increase Novolog  Meal Coverage to 7 units TID with meals  Note Current A1c Pending Pt does NOT have health insurance coverage.  If you decide to send pt home on insulin , may be best to d/c pt on Reli On 70/30 Insulin  from [facility] which can be purchased out of pocket for $43 for 5 pens (1500 units total)   Addendum 3pm--Spoke with pt by phone this afternoon.  This diabetes [employer] covering system wide remotely.  Pt told me he has 3 CBG meters at home.  Checking usually once per day and usually sees CBGs in the 300 range.  Discussed with pt that his A1c has still not resulted yet but that I suspect it will be elevated given his infection, admission glucose of 418, and self reported CBGs of 300.  Plans to make follow up visit with [facility].  Open to use insulin  for home.  Discussed with pt the importance of good CBG control to help infection heal and prevent further infections and prevent long term complications.  Pt told me he has been self injecting in the hospital and feels good about giving self insulin  when he goes home.  Discussed with pt that I plan to recommend 70/30 insulin  BID from [facility] ($43 for 5 pens).  Explained what 70/30 insulin  is, how it works, when to take.  Also discussed with pt the possibility of using CGM in the future if/when pt gets new insurance coverage.  Explained what CGM is and how it differs from fingerstick CBG.  Also explained the many benefits of using CGM to help adjust meds.  Pt requested follow up call tomorrow AM.   --Will follow patient during hospitalization--  Adina Rudolpho Arrow RN, MSN, CDCES Diabetes Coordinator Inpatient Glycemic Control Team Team Pager: [phone] (8a-5p)

## 2024-04-28 NOTE — Progress Notes (Signed)
 Pharmacy Antibiotic Note  Andrew Ritter is a 33 y.o. male admitted on 04/26/2024 with cellulitis.  Pharmacy has been consulted for cefepime  and vancomycin  dosing. -per H&P: insect bite- significant cellulitis, swelling in the posterior neck area, surrounding lymphadenopathy, attending into the muscles area at posterior cervical paraspinal muscles, fortunately there is no abscess or fluid collection.  -Hx Diabetes  Renal function improved from admit, will adjust vancomycin  dosing. Patient continues to be afebrile with wbc of 13.5.  Plan: Will order Cefepime  2 gm IV q8h  Vancomycin  1500 q12 hours AUC 400-550. Expected AUC: 524 SCr used: 0.88 Vd 0.5  BMI 40  Continue to assess renal fxn, cultures, length of therapy, etc   Height: 5' 7 (170.2 cm) Weight: 118.8 kg (261 lb 14.5 oz) IBW/kg (Calculated) : 66.1  Temp (24hrs), Avg:98.3 F (36.8 C), Min:98 F (36.7 C), Max:98.6 F (37 C)  Recent Labs  Lab 04/26/24 1603 04/27/24 0426 04/28/24 0444  WBC 11.2* 13.5*  --   CREATININE 1.18 1.00 0.88  LATICACIDVEN 1.6  --   --     Estimated Creatinine Clearance: 145.9 mL/min (by C-G formula based on SCr of 0.88 mg/dL).    Allergies  Allergen Reactions   Shrimp [Shellfish Allergy] Anaphylaxis   Citrullus Vulgaris    Cvs Lubricant Eye Drops [Benzalkonium Chloride]    Other     Cats-Face swelling and itching Eye drops for pink eye-eyes became more swollen    Antimicrobials this admission: Ceftriaxone  12/4 x 1 vancomycin  12/4 >>   Cefepime  12/4 >>    Dose adjustments this admission:    Microbiology results: 12/4 BCx: ngtd  Thank you for allowing pharmacy to be a part of this patient's care.  Dempsey Blush PharmD., BCPS Clinical Pharmacist 04/28/2024 10:18 AM

## 2024-04-28 NOTE — Progress Notes (Signed)
 PROGRESS NOTE    Andrew Ritter  FMW:[id number] DOB: [birth year] DOA: 04/26/2024 PCP: Edman Meade PEDLAR, FNP    Chief Complaint  Patient presents with    Insect Bite Cellulitis     Brief Narrative:  As per H&P written by Dr. Sherlon on 04/26/2024 Keanon Bevins  is a 33 y.o. male, with past medical history of hypertension, obesity, diabetes mellitus, hyperlipidemia, patient presents to ED secondary to complaints of posterior neck pain, swelling and edema, reports he felt something bite him on the back of his neck, and over the last 2 days he has been progressive swelling, tingling to the left hand, he tried Tylenol , warm compress with no help, reports worsening pain and fever 101 at work today - In ED CT neck obtained showing significant cellulitis, with stranding into the paraspinal cervical muscles and left trapezius, given he is diabetic [facility] consulted to admit for IV antibiotics  Assessment & Plan: 1-cellulitis - Continue IV antibiotics, adequate hydration, analgesics and supportive care. - NSAIDs to assist with anti-inflammatory process will be provided (ibuprofen  600 mg 3 times a day). - Follow clinical response.  2-hypertension - Continue current antihypertensive agents and follow vital signs. - Heart healthy/low-sodium diet discussed with patient.  3-type 2 diabetes mellitus with hyperglycemia - Follow A1c results - Patient CBGs demonstrating uncontrolled diabetes - Continue sliding scale insulin ; adjusting long-acting medication to twice a day; continue meal coverage.   - Follow CBG fluctuation and further adjust hypoglycemic regimen as needed..  4-hyperlipidemia - Continue statin.  5-morbid obesity -Body mass index is 41.02 kg/m.  - Low-calorie diet, portion control and increase physical activity discussed with patient.  6-GERD/GI prophylaxis - Continue PPI.     DVT prophylaxis: Lovenox  Code Status: Full code. Family Communication:  Significant other present at bedside. Disposition:   Status is: Inpatient Remains inpatient appropriate because: IV antibiotics.   Consultants:  None  Procedures: See below for x-ray reports.  Antimicrobials: Vancomycin  and cefepime .   Subjective: Still with low-grade temperatures; no chest pain, no nausea, no vomiting.  Reports ongoing tenderness and induration in the posterior aspect of his neck and upper back.  Patient blood sugar continue to be elevated and fluctuating.  Objective: Vitals:   04/27/24 1312 04/27/24 2121 04/28/24 0417 04/28/24 1252  BP: (!) 147/96 126/89 135/86 102/67  Pulse:  81 82 84  Resp:  20 20 20   Temp:  98 F (36.7 C) 98.6 F (37 C) 97.9 F (36.6 C)  TempSrc:  Oral  Oral  SpO2: 99% 97% 93% 96%  Weight:      Height:        Intake/Output Summary (Last 24 hours) at 04/28/2024 1655 Last data filed at 04/28/2024 1252 Gross per 24 hour  Intake 1260 ml  Output --  Net 1260 ml   Filed Weights   04/26/24 1439  Weight: 118.8 kg    Examination: General exam: Alert, awake, oriented x 3; still experiencing low-grade temperature; no nausea, no vomiting, no chest pain. Respiratory system: Good saturation on room air. Cardiovascular system:RRR.  No rubs, no gallops, no JVD. Gastrointestinal system: Abdomen is obese, nondistended, soft and nontender. No organomegaly or masses felt. Normal bowel sounds heard. Central nervous system: No focal neurological deficits. Extremities: No cyanosis or clubbing Skin: No petechiae; posterior aspect of his neck and upper back demonstrated induration, warmth sensation, tenderness to palpation and mild open boil with minimal purulent discharge. Psychiatry: Judgement and insight appear normal. Mood & affect appropriate.   Data  Reviewed: I have personally reviewed following labs and imaging studies  CBC: Recent Labs  Lab 04/26/24 1603 04/27/24 0426  WBC 11.2* 13.5*  NEUTROABS 7.0  --   HGB 17.6* 16.7  HCT 52.1*  49.7  MCV 84.7 85.7  PLT 175 173    Basic Metabolic Panel: Recent Labs  Lab 04/26/24 1603 04/27/24 0426 04/28/24 0444  NA 131* 132* 133*  K 4.9 3.8 3.4*  CL 95* 97* 98  CO2 26 22 25   GLUCOSE 418* 406* 293*  BUN 7 9 8   CREATININE 1.18 1.00 0.88  CALCIUM  9.3 8.5* 8.6*    GFR: Estimated Creatinine Clearance: 145.9 mL/min (by C-G formula based on SCr of 0.88 mg/dL).  Liver Function Tests: Recent Labs  Lab 04/26/24 1603  AST 17  ALT 21  ALKPHOS 137*  BILITOT 2.2*  PROT 8.0  ALBUMIN 4.4    CBG: Recent Labs  Lab 04/27/24 1401 04/27/24 1637 04/27/24 2111 04/28/24 0758 04/28/24 1129  GLUCAP 336* 431* 313* 290* 324*     Recent Results (from the past 240 hours)  Blood culture (routine x 2)     Status: None (Preliminary result)   Collection Time: 04/26/24  4:03 PM   Specimen: BLOOD  Result Value Ref Range Status   Specimen Description BLOOD BLOOD LEFT ARM  Final   Special Requests   Final    BOTTLES DRAWN AEROBIC AND ANAEROBIC Blood Culture results may not be optimal due to an inadequate volume of blood received in culture bottles   Culture   Final    NO GROWTH 2 DAYS Performed at [facility], [address] [place], [place] [address]    Report Status PENDING  Incomplete  Blood culture (routine x 2)     Status: None (Preliminary result)   Collection Time: 04/26/24  4:25 PM   Specimen: BLOOD  Result Value Ref Range Status   Specimen Description BLOOD RIGHT ANTECUBITAL  Final   Special Requests   Final    BOTTLES DRAWN AEROBIC AND ANAEROBIC Blood Culture adequate volume   Culture   Final    NO GROWTH 2 DAYS Performed at [facility], [address], [place], [place] [address]    Report Status PENDING  Incomplete    Radiology Studies: CT Soft Tissue Neck W Contrast Result Date: 04/26/2024 EXAM: CT NECK WITH CONTRAST 04/26/2024 05:02:29 PM TECHNIQUE: CT of the neck was performed with the administration of 75 mL of iohexol  (OMNIPAQUE ) 300 MG/ML solution.  Multiplanar reformatted images are provided for review. Automated exposure control, iterative reconstruction, and/or weight based adjustment of the mA/kV was utilized to reduce the radiation dose to as low as reasonably achievable. COMPARISON: None available. CLINICAL HISTORY: Soft tissue infection posterior neck. Possible insect bite to the posterior neck 2 days ago. Swelling. Left hand numbness. FINDINGS: AERODIGESTIVE TRACT: No discrete mass. No edema. SALIVARY GLANDS: The parotid and submandibular glands are unremarkable. THYROID: Unremarkable. LYMPH NODES: Borderline to mildly enlarged posterior left level III/V lymph nodes measuring 9 mm in short axis, likely reactive. SOFT TISSUES: Subcutaneous soft tissue swelling and inflammatory fat stranding throughout the posterior neck primarily left of midline. Inflammation extends around the left trapezius muscle and to the posterior cervical paraspinal muscles. No organized fluid collection or soft tissue gas. BRAIN, ORBITS, SINUSES AND MASTOIDS: Mild mucosal thickening in the maxillary sinuses. Clear mastoid air cells. No acute abnormality. LUNGS AND MEDIASTINUM: No acute abnormality. BONES: Carious left mandibular 1st molar tooth. No focal bone abnormality. IMPRESSION: 1. Soft  tissue swelling/inflammation throughout the posterior neck primarily on the left without evidence of abscess. Electronically signed by: Dasie Hamburg MD 04/26/2024 06:32 PM EST RP Workstation: [id number]    Scheduled Meds:  amLODipine   5 mg Oral Daily   aspirin  EC  81 mg Oral Daily   enoxaparin  (LOVENOX ) injection  0.5 mg/kg Subcutaneous Q24H   ibuprofen   600 mg Oral TID   insulin  aspart  0-15 Units Subcutaneous TID WC   insulin  aspart  0-5 Units Subcutaneous QHS   insulin  aspart  5 Units Subcutaneous TID WC   insulin  glargine-yfgn  12 Units Subcutaneous BID   insulin  starter kit- pen needles  1 kit Other Once   methocarbamol   500 mg Oral TID   pantoprazole   40 mg Oral BID    rosuvastatin   10 mg Oral QHS   Continuous Infusions:  ceFEPime  (MAXIPIME ) IV 2 g (04/28/24 1222)   vancomycin        LOS: 2 days    Time spent: 50 minutes    Eric Nunnery, MD [facility]   To contact the attending provider between 7A-7P or the covering provider during after hours 7P-7A, please log into the web site [email or web address] and access using universal [place] password for that web site. If you do not have the password, please call the hospital operator.  04/28/2024, 4:55 PM

## 2024-04-28 NOTE — Plan of Care (Signed)

## 2024-04-28 NOTE — Plan of Care (Signed)
  Problem: Education: Goal: Ability to describe self-care measures that may prevent or decrease complications (Diabetes Survival Skills Education) will improve Outcome: Progressing   Problem: Coping: Goal: Ability to adjust to condition or change in health will improve Outcome: Progressing   Problem: Metabolic: Goal: Ability to maintain appropriate glucose levels will improve Outcome: Progressing   Problem: Education: Goal: Knowledge of General Education information will improve Description: Including pain rating scale, medication(s)/side effects and non-pharmacologic comfort measures Outcome: Progressing   Problem: Clinical Measurements: Goal: Ability to maintain clinical measurements within normal limits will improve Outcome: Progressing Goal: Will remain free from infection Outcome: Progressing   Problem: Safety: Goal: Ability to remain free from injury will improve Outcome: Progressing   Problem: Clinical Measurements: Goal: Ability to avoid or minimize complications of infection will improve Outcome: Progressing   Problem: Skin Integrity: Goal: Skin integrity will improve Outcome: Progressing

## 2024-04-29 LAB — GLUCOSE, CAPILLARY
Glucose-Capillary: 337 mg/dL — ABNORMAL HIGH (ref 70–99)
Glucose-Capillary: 303 mg/dL — ABNORMAL HIGH (ref 70–99)
Glucose-Capillary: 277 mg/dL — ABNORMAL HIGH (ref 70–99)
Glucose-Capillary: 292 mg/dL — ABNORMAL HIGH (ref 70–99)

## 2024-04-29 MED ORDER — INSULIN ASPART 100 UNIT/ML IJ SOLN
7.0000 [IU] | Freq: Three times a day (TID) | INTRAMUSCULAR | Status: DC
  Administered 2024-04-29 – 2024-04-30 (×3): 7 [IU] via SUBCUTANEOUS
  Filled 2024-04-29 (×2): qty 1

## 2024-04-29 MED ORDER — IBUPROFEN 600 MG PO TABS
600.0000 mg | ORAL_TABLET | Freq: Three times a day (TID) | ORAL | Status: DC
  Administered 2024-04-29 – 2024-04-30 (×4): 600 mg via ORAL
  Filled 2024-04-29 (×4): qty 1

## 2024-04-29 MED ORDER — INSULIN GLARGINE-YFGN 100 UNIT/ML ~~LOC~~ SOLN
15.0000 [IU] | Freq: Two times a day (BID) | SUBCUTANEOUS | Status: DC
  Administered 2024-04-29 – 2024-04-30 (×3): 15 [IU] via SUBCUTANEOUS
  Filled 2024-04-29 (×5): qty 0.15

## 2024-04-29 NOTE — Progress Notes (Signed)
 PROGRESS NOTE    Andrew Ritter  FMW:[id number] DOB: 02-04-1991 DOA: 04/26/2024 PCP: Edman Meade PEDLAR, FNP    Chief Complaint  Patient presents with    Insect Bite Cellulitis     Brief Narrative:  As per H&P written by Dr. Sherlon on 04/26/2024 Andrew Ritter  is a 33 y.o. male, with past medical history of hypertension, obesity, diabetes mellitus, hyperlipidemia, patient presents to ED secondary to complaints of posterior neck pain, swelling and edema, reports he felt something bite him on the back of his neck, and over the last 2 days he has been progressive swelling, tingling to the left hand, he tried Tylenol , warm compress with no help, reports worsening pain and fever 101 at work today - In ED CT neck obtained showing significant cellulitis, with stranding into the paraspinal cervical muscles and left trapezius, given he is diabetic [facility] consulted to admit for IV antibiotics  Assessment & Plan: 1-cellulitis - Continue IV antibiotics, adequate hydration, analgesics and supportive care. - NSAIDs to assist with anti-inflammatory process will be provided (ibuprofen  600 mg 3 times a day). - Follow clinical response.  2-hypertension - Continue current antihypertensive agents and follow vital signs. - Heart healthy/low-sodium diet discussed with patient.  3-type 2 diabetes mellitus with hyperglycemia - Follow A1c results - Patient CBGs demonstrating uncontrolled diabetes - Continue sliding scale insulin ; adjusting long-acting medication to twice a day; continue meal coverage.   - Follow CBG fluctuation and further adjust hypoglycemic regimen as needed..  4-hyperlipidemia - Continue statin.  5-morbid obesity -Body mass index is 41.02 kg/m.  - Low-calorie diet, portion control and increase physical activity discussed with patient.  6-GERD/GI prophylaxis - Continue PPI.  DVT prophylaxis: Lovenox  Code Status: Full code. Family Communication: Significant  other present at bedside. Disposition:   Status is: Inpatient Remains inpatient appropriate because: IV antibiotics.   Consultants:  None  Procedures: See below for x-ray reports.  Antimicrobials: Vancomycin  and cefepime .   Subjective: Patient CBGs for the most part better control with most recent insulin  therapy adjustment; still spiking low-grade temperature and complaining of significant pain/induration in the posterior aspect of his neck.  Patient in no acute distress.  Objective: Vitals:   04/28/24 1252 04/28/24 2010 04/29/24 0406 04/29/24 1339  BP: 102/67 114/77 122/76 112/80  Pulse: 84 81 88 80  Resp: 20 16 16 20   Temp: 97.9 F (36.6 C) 98.1 F (36.7 C) 98.2 F (36.8 C) 97.6 F (36.4 C)  TempSrc: Oral Oral Oral Oral  SpO2: 96% 95% 98% 98%  Weight:      Height:        Intake/Output Summary (Last 24 hours) at 04/29/2024 1449 Last data filed at 04/29/2024 1300 Gross per 24 hour  Intake 1946.4 ml  Output --  Net 1946.4 ml   Filed Weights   04/26/24 1439  Weight: 118.8 kg    Examination: General exam: Alert, awake, oriented x 3; low-grade temperature appreciated overnight.  Complaining of ongoing pain/induration in the posterior aspect of his neck. Respiratory system: Good saturation on room air. Cardiovascular system:RRR.  No rubs or gallops; no JVD. Gastrointestinal system: Abdomen is obese, nondistended, soft and nontender. No organomegaly or masses felt. Normal bowel sounds heard. Central nervous system: Moving 4 limbs spontaneously.  No focal neurological deficits. Extremities: No cyanosis or clubbing Skin: No petechiae.  Induration, tenderness and mildly serosanguineous/purulent discharge appreciated on the posterior aspect of his neck upper back from cellulitic process. Psychiatry: Judgement and insight appear normal. Mood &  affect appropriate.   Data Reviewed: I have personally reviewed following labs and imaging studies  CBC: Recent Labs  Lab  04/26/24 1603 04/27/24 0426  WBC 11.2* 13.5*  NEUTROABS 7.0  --   HGB 17.6* 16.7  HCT 52.1* 49.7  MCV 84.7 85.7  PLT 175 173    Basic Metabolic Panel: Recent Labs  Lab 04/26/24 1603 04/27/24 0426 04/28/24 0444  NA 131* 132* 133*  K 4.9 3.8 3.4*  CL 95* 97* 98  CO2 26 22 25   GLUCOSE 418* 406* 293*  BUN 7 9 8   CREATININE 1.18 1.00 0.88  CALCIUM  9.3 8.5* 8.6*    GFR: Estimated Creatinine Clearance: 145.9 mL/min (by C-G formula based on SCr of 0.88 mg/dL).  Liver Function Tests: Recent Labs  Lab 04/26/24 1603  AST 17  ALT 21  ALKPHOS 137*  BILITOT 2.2*  PROT 8.0  ALBUMIN 4.4    CBG: Recent Labs  Lab 04/28/24 0758 04/28/24 1129 04/28/24 2006 04/29/24 0704 04/29/24 1130  GLUCAP 290* 324* 296* 337* 303*     Recent Results (from the past 240 hours)  Blood culture (routine x 2)     Status: None (Preliminary result)   Collection Time: 04/26/24  4:03 PM   Specimen: BLOOD  Result Value Ref Range Status   Specimen Description BLOOD BLOOD LEFT ARM  Final   Special Requests   Final    BOTTLES DRAWN AEROBIC AND ANAEROBIC Blood Culture results may not be optimal due to an inadequate volume of blood received in culture bottles   Culture   Final    NO GROWTH 3 DAYS Performed at [facility], [address] [place], [place] [address]    Report Status PENDING  Incomplete  Blood culture (routine x 2)     Status: None (Preliminary result)   Collection Time: 04/26/24  4:25 PM   Specimen: BLOOD  Result Value Ref Range Status   Specimen Description BLOOD RIGHT ANTECUBITAL  Final   Special Requests   Final    BOTTLES DRAWN AEROBIC AND ANAEROBIC Blood Culture adequate volume   Culture   Final    NO GROWTH 3 DAYS Performed at [facility], [address] [place], [place] [address]    Report Status PENDING  Incomplete    Radiology Studies: No results found.   Scheduled Meds:  amLODipine   5 mg Oral Daily   aspirin  EC  81 mg Oral Daily   enoxaparin  (LOVENOX )  injection  0.5 mg/kg Subcutaneous Q24H   ibuprofen   600 mg Oral TID   insulin  aspart  0-15 Units Subcutaneous TID WC   insulin  aspart  0-5 Units Subcutaneous QHS   insulin  aspart  7 Units Subcutaneous TID WC   insulin  glargine-yfgn  15 Units Subcutaneous BID   insulin  starter kit- pen needles  1 kit Other Once   methocarbamol   500 mg Oral TID   pantoprazole   40 mg Oral BID   rosuvastatin   10 mg Oral QHS   Continuous Infusions:  ceFEPime  (MAXIPIME ) IV 2 g (04/29/24 1411)   vancomycin  Stopped (04/29/24 9356)     LOS: 3 days    Time spent: 50 minutes    Eric Nunnery, MD [facility]   To contact the attending provider between 7A-7P or the covering provider during after hours 7P-7A, please log into the web site [email or web address] and access using universal [place] password for that web site. If you do not have the password, please call the hospital operator.  04/29/2024, 2:49  PM

## 2024-04-29 NOTE — Plan of Care (Signed)

## 2024-04-29 NOTE — Inpatient Diabetes Management (Signed)
 Inpatient Diabetes Program Recommendations  AACE/ADA: New Consensus Statement on Inpatient Glycemic Control (2015)  Target Ranges:  Prepandial:   less than 140 mg/dL      Peak postprandial:   less than 180 mg/dL (1-2 hours)      Critically ill patients:  140 - 180 mg/dL    Latest Reference Range & Units 04/28/24 07:58 04/28/24 11:29 04/28/24 20:06  Glucose-Capillary 70 - 99 mg/dL 709 (H)  13 units Novolog   15 units Semglee  @0952  324 (H)  16 units Novolog   16 units Novolog  Given at 1735 (no CBG downloaded) 296 (H)  3 units Novolog    12 units Semglee  @2102    (H): Data is abnormally high  Latest Reference Range & Units 04/29/24 07:04  Glucose-Capillary 70 - 99 mg/dL 662 (H)  11 units Novolog    (H): Data is abnormally high   Home DM Meds: Metformin  500 mg daily                              Glipizide  5 mg BID   Current Orders: Semglee  15 units BID                           Novolog  Moderate Correction Scale/ SSI (0-15 units) TID AC + HS                           Novolog  7 units TID with meals    Note Semglee  and Novolog  Meal Coverage doses both increased this AM  Note Current A1c Pending Pt does NOT have health insurance coverage.  If you decide to send pt home on insulin , may be best to d/c pt on Reli On 70/30 Insulin  from [facility] which can be purchased out of pocket for $43 for 5 pens (1500 units total)    Discharge Recommendations: Intermediate acting recommendations: insulin  isophane & regular (NOVOLIN 70/30 RELION PEN) ([facility] Only) Dose TBD at time of discharge  Supply/Referral recommendations: Pen needles - standard   Use Adult Diabetes Insulin  Treatment Post Discharge order set.   --Will follow patient during hospitalization--  Adina Rudolpho Arrow RN, MSN, CDCES Diabetes Coordinator Inpatient Glycemic Control Team Team Pager: [phone] (8a-5p)

## 2024-04-30 DIAGNOSIS — E785 Hyperlipidemia, unspecified: Secondary | ICD-10-CM

## 2024-04-30 DIAGNOSIS — E66813 Obesity, class 3: Secondary | ICD-10-CM

## 2024-04-30 LAB — BASIC METABOLIC PANEL WITH GFR
Anion gap: 8 (ref 5–15)
BUN: 9 mg/dL (ref 6–20)
CO2: 25 mmol/L (ref 22–32)
Calcium: 8.5 mg/dL — ABNORMAL LOW (ref 8.9–10.3)
Chloride: 100 mmol/L (ref 98–111)
Creatinine, Ser: 0.97 mg/dL (ref 0.61–1.24)
GFR, Estimated: >60 mL/min (ref >60)
Glucose, Bld: 355 mg/dL — ABNORMAL HIGH (ref 70–99)
Potassium: 4.1 mmol/L (ref 3.5–5.1)
Sodium: 132 mmol/L — ABNORMAL LOW (ref 135–145)

## 2024-04-30 LAB — CBC
HCT: 47.1 % (ref 39.0–52.0)
Hemoglobin: 15.8 g/dL (ref 13.0–17.0)
MCH: 28.6 pg (ref 26.0–34.0)
MCHC: 33.5 g/dL (ref 30.0–36.0)
MCV: 85.2 fL (ref 80.0–100.0)
Platelets: 203 K/uL (ref 150–400)
RBC: 5.53 MIL/uL (ref 4.22–5.81)
RDW: 13.2 % (ref 11.5–15.5)
WBC: 7.4 K/uL (ref 4.0–10.5)
nRBC: 0 % (ref 0.0–0.2)

## 2024-04-30 LAB — GLUCOSE, CAPILLARY: Glucose-Capillary: 379 mg/dL — ABNORMAL HIGH (ref 70–99)

## 2024-04-30 LAB — HEMOGLOBIN A1C
Hgb A1c MFr Bld: 15.3 % — ABNORMAL HIGH (ref 4.8–5.6)
Mean Plasma Glucose: 392 mg/dL

## 2024-04-30 MED ORDER — METFORMIN HCL ER 750 MG PO TB24: 750.0000 mg | ORAL_TABLET | Freq: Two times a day (BID) | ORAL | 2 refills | Status: AC

## 2024-04-30 MED ORDER — LANCET DEVICE MISC: 1.0000 | 0 refills | Status: DC

## 2024-04-30 MED ORDER — IBUPROFEN 600 MG PO TABS: ORAL_TABLET | ORAL | 0 refills | Status: AC

## 2024-04-30 MED ORDER — BLOOD GLUCOSE TEST VI STRP: 1.0000 | ORAL_STRIP | 0 refills | Status: DC

## 2024-04-30 MED ORDER — METFORMIN HCL ER 750 MG PO TB24: 750.0000 mg | ORAL_TABLET | Freq: Two times a day (BID) | ORAL | 2 refills | Status: DC

## 2024-04-30 MED ORDER — METHOCARBAMOL 500 MG PO TABS: 500.0000 mg | ORAL_TABLET | Freq: Three times a day (TID) | ORAL | 0 refills | Status: AC | PRN

## 2024-04-30 MED ORDER — DOXYCYCLINE HYCLATE 100 MG PO TABS: 100.0000 mg | ORAL_TABLET | Freq: Two times a day (BID) | ORAL | 0 refills | Status: DC

## 2024-04-30 MED ORDER — CEPHALEXIN 750 MG PO CAPS: 750.0000 mg | ORAL_CAPSULE | Freq: Three times a day (TID) | ORAL | 0 refills | Status: AC

## 2024-04-30 MED ORDER — LANCETS MISC: 1.0000 | 0 refills | Status: AC

## 2024-04-30 MED ORDER — METHOCARBAMOL 500 MG PO TABS: 500.0000 mg | ORAL_TABLET | Freq: Three times a day (TID) | ORAL | 0 refills | Status: DC | PRN

## 2024-04-30 MED ORDER — INSULIN ASPART PROT & ASPART (70-30 MIX) 100 UNIT/ML PEN: 15.0000 [IU] | PEN_INJECTOR | Freq: Two times a day (BID) | SUBCUTANEOUS | 11 refills | Status: DC

## 2024-04-30 MED ORDER — LANCET DEVICE MISC: 1.0000 | 0 refills | Status: AC

## 2024-04-30 MED ORDER — OXYCODONE HCL 5 MG PO TABS: 10.0000 mg | ORAL_TABLET | Freq: Four times a day (QID) | ORAL | 0 refills | Status: AC | PRN

## 2024-04-30 MED ORDER — LANCETS MISC: 1.0000 | 0 refills | Status: DC

## 2024-04-30 MED ORDER — IBUPROFEN 600 MG PO TABS: ORAL_TABLET | ORAL | 0 refills | Status: DC

## 2024-04-30 MED ORDER — BLOOD GLUCOSE MONITORING SUPPL DEVI: 1.0000 | 0 refills | Status: AC

## 2024-04-30 MED ORDER — OXYCODONE HCL 5 MG PO TABS: 10.0000 mg | ORAL_TABLET | Freq: Four times a day (QID) | ORAL | 0 refills | Status: DC | PRN

## 2024-04-30 MED ORDER — INSULIN ASPART PROT & ASPART (70-30 MIX) 100 UNIT/ML PEN: 15.0000 [IU] | PEN_INJECTOR | Freq: Two times a day (BID) | SUBCUTANEOUS | 11 refills | Status: AC

## 2024-04-30 MED ORDER — PEN NEEDLES 5/16" 31G X 8 MM MISC: 2 refills | Status: DC

## 2024-04-30 MED ORDER — DOXYCYCLINE HYCLATE 100 MG PO TABS: 100.0000 mg | ORAL_TABLET | Freq: Two times a day (BID) | ORAL | 0 refills | Status: AC

## 2024-04-30 MED ORDER — CEPHALEXIN 750 MG PO CAPS: 750.0000 mg | ORAL_CAPSULE | Freq: Three times a day (TID) | ORAL | 0 refills | Status: DC

## 2024-04-30 MED ORDER — BLOOD GLUCOSE MONITORING SUPPL DEVI: 1.0000 | 0 refills | Status: DC

## 2024-04-30 MED ORDER — PEN NEEDLES 5/16" 31G X 8 MM MISC: 2 refills | Status: AC

## 2024-04-30 MED ORDER — BLOOD GLUCOSE TEST VI STRP: 1.0000 | ORAL_STRIP | 0 refills | Status: AC

## 2024-04-30 NOTE — TOC Progression Note (Signed)
 Transition of Care [facility]) - Progression Note    Patient Details  Name: Andrew Ritter MRN: [id number] Date of Birth: [birth year]  Transition of Care [facility]) CM/SW Contact  Sharlyne Stabs, RN Phone Number: 04/30/2024, 10:55 AM  Clinical Narrative:   IPCM consulted for a MATCH VOUCHER, copy attached below.   MATCH MEDICATION ASSISTANCE CARD Pharmacies please call [phone] for claim processing assistance.  Rx BIN: [id number] Rx Group: [id number] Rx PCN: [id number] Relationship Code: 1 Person Code: 01  Patient ID (MRN): Andrew Ritter MRN: [id number]      Patient Name: Andrew Ritter   Patient DOB: [birth year]   Discharge Date: 04/30/24  Expiration Date: 05/08/24 (must be filled within 7 days of discharge)     Barriers to Discharge: Barriers Resolved  Expected Discharge Plan and Services         Expected Discharge Date: 04/30/24                                     Social Drivers of Health (SDOH) Interventions SDOH Screenings   Food Insecurity: Food Insecurity Present (04/26/2024)  Housing: Low Risk  (04/26/2024)  Transportation Needs: No Transportation Needs (04/26/2024)  Utilities: At Risk (04/26/2024)  Depression (PHQ2-9): Low Risk  (08/04/2023)  Financial Resource Strain: Low Risk  (05/26/2023)  Physical Activity: Unknown (05/26/2023)  Social Connections: Moderately Integrated (05/26/2023)  Stress: Stress Concern Present (05/26/2023)  Tobacco Use: Low Risk  (04/26/2024)    Readmission Risk Interventions     No data to display

## 2024-04-30 NOTE — Progress Notes (Signed)
 Patient is being discharged home. Pt wife is at bedside. This RN in room with MD as MD was explaining discharge instructions and that he was being discharged today. Pt and patient wife very upset that he is be discharged. MD reassured patient and family that he is stable to discharge home and continue oral antibiotics. Patient and family still upset. This RN and leadership reinforced education and follow up outpatient. Patient has been self administering insulin  since admitted and states he was provided education on insulin  pen. Pt verbalizes being comfortable checking and doing insulin  at home and understands that he will need to follow up outpatient with physician.Printed prescriptions provided as well as Buyer, retail. Work noted provided as well per pt request. Pt wife is his ride home.

## 2024-04-30 NOTE — Inpatient Diabetes Management (Signed)
 Inpatient Diabetes Program Recommendations  AACE/ADA: New Consensus Statement on Inpatient Glycemic Control (2015)  Target Ranges:  Prepandial:   less than 140 mg/dL      Peak postprandial:   less than 180 mg/dL (1-2 hours)      Critically ill patients:  140 - 180 mg/dL   Lab Results  Component Value Date   GLUCAP 379 (H) 04/30/2024   HGBA1C 11.5 (H) 05/27/2023   Inpatient Diabetes Program Recommendations:   Spoke with patient via phone (DM coordinator @ [facility] campus). Patient has been administering his own insulin  injections and was shown and practiced how to use an insulin  pen. Patient states he received a message from  [facility] that his 70/30 insulin  prescription  was not in stock. Secure chat to Dr. Ricky to request a printed prescription for patient to take to [facility] pharmacy for less expensive insulin  and availability.  Patient states he feels comfortable giving injections and has no further questions.  Discharge Recommendations: Intermediate acting recommendations: insulin  isophane & regular (NOVOLIN 70/30 RELION PEN) ([facility] Only) Dose TBD at time of discharge  Supply/Referral recommendations: Pen needles - standard  Use Adult Diabetes Insulin  Treatment Post Discharge order set.  Thank you, Taitum Alms E. Ryonna Cimini, RN, MSN, CNS, CDCES  Diabetes Coordinator Inpatient Glycemic Control Team Team Pager [phone] (8am-5pm) 04/30/2024 9:31 AM

## 2024-04-30 NOTE — TOC CM/SW Note (Signed)
 Transition of Care (TOC) CM/SW Note   CSW spoke with pt about [facility] voucher, he is interested in it at this time. RN CM completed and CSW delivered to pt at bedside. CSW also provided pt with free clinic and care connect paperwork for him to follow up with.

## 2024-04-30 NOTE — Discharge Summary (Signed)
 Physician Discharge Summary   Patient: Andrew Ritter MRN: [id number] DOB: 1991-02-09  Admit date:     04/26/2024  Discharge date: 04/30/24  Discharge Physician: Eric Nunnery   PCP: Edman Meade PEDLAR, FNP   Recommendations at discharge:  Reassess complete resolution of acute posterior neck/upper back cellulitic process Repeat basic metabolic panel to follow electrolytes and renal function Continue close monitoring of patient's CBGs/A1c with further adjustment to hypoglycemic regimen as needed Reassess blood pressure and adjust antihypertensive treatment as required.  Discharge Diagnoses: Principal Problem:   Cellulitis Active Problems:   Hypertension   Type 2 diabetes mellitus with hyperglycemia (HCC)   Hyperlipidemia   Obesity, Class III, BMI 40-49.9 (morbid obesity) (HCC)  Brief Narrative:  As per H&P written by Dr. Sherlon on 04/26/2024 Andrew Ritter  is a 33 y.o. male, with past medical history of hypertension, obesity, diabetes mellitus, hyperlipidemia, patient presents to ED secondary to complaints of posterior neck pain, swelling and edema, reports he felt something bite him on the back of his neck, and over the last 2 days he has been progressive swelling, tingling to the left hand, he tried Tylenol , warm compress with no help, reports worsening pain and fever 101 at work today - In ED CT neck obtained showing significant cellulitis, with stranding into the paraspinal cervical muscles and left trapezius, given he is diabetic [facility] hospital[facility] consulted to admit for IV antibiotics  Assessment and Plan: 1-cellulitis posterior neck/upper back - After 3 days of IV antibiotics and adequate response patient has been transition to oral doxycycline  and Keflex  to complete therapy at discharge. - NSAIDs to assist with anti-inflammatory process has been provided (ibuprofen  600 mg 3 times a day). - Outpatient follow-up with PCP in the next day and has been requested.    2-hypertension - Continue current antihypertensive agents and follow blood pressure trend. - Heart healthy diet discussed with patient.   3-type 2 diabetes mellitus with hyperglycemia - A1c results remains pending at discharge. -Most recent information demonstrating levels above 11.5; with significant component of hyperglycemia throughout hospitalization. - Patient has been instructed to maintain adequate hydration, to follow a modified carbohydrate diet and to take medications as prescribed - At discharge 7030 twice a day along with the use of metformin  has been prescribed. - Close monitoring of CBG fluctuation/A1c to further adjust hypoglycemia regimen has been recommended.   4-hyperlipidemia - Continue statin.   5-morbid obesity -Body mass index is 41.02 kg/m.  - Low-calorie diet, portion control and increase physical activity discussed with patient.   6-GERD/GI prophylaxis - Continue PPI.   Consultants: Diabetes coordinator Procedures performed: See below for x-ray reports. Disposition: Home Diet recommendation: Heart healthy/low calorie and modified carbohydrate diet.  DISCHARGE MEDICATION: Allergies as of 04/30/2024       Reactions   Shrimp [shellfish Allergy] Anaphylaxis   Citrullus Vulgaris    [facility] Lubricant Eye Drops [benzalkonium Chloride]    Other    Cats-Face swelling and itching Eye drops for pink eye-eyes became more swollen        Medication List     STOP taking these medications    DAYQUIL MULTI-SYMPTOM COLD/FLU PO   glipiZIDE  5 MG tablet Commonly known as: GLUCOTROL    metFORMIN  500 MG tablet Commonly known as: GLUCOPHAGE  Replaced by: metFORMIN  750 MG 24 hr tablet   naproxen  sodium 220 MG tablet Commonly known as: ALEVE    sodium chloride  0.65 % Soln nasal spray Commonly known as: OCEAN       TAKE these  medications    amLODipine  5 MG tablet Commonly known as: NORVASC  Take 1 tablet (5 mg total) by mouth daily.   aspirin  EC 81 MG  tablet Take 81 mg by mouth daily. Swallow whole.   Blood Glucose Monitoring Suppl Devi 1 each by Does not apply route as directed. Dispense based on patient and insurance preference. Use up to four times daily as directed. (FOR ICD-10 E10.9, E11.9).   BLOOD GLUCOSE TEST STRIPS Strp 1 each by Does not apply route as directed. Dispense based on patient and insurance preference. Use up to four times daily as directed. (FOR ICD-10 E10.9, E11.9).   cephALEXin  750 MG capsule Commonly known as: Keflex  Take 1 capsule (750 mg total) by mouth 3 (three) times daily for 7 days.   doxycycline  100 MG tablet Commonly known as: VIBRA -TABS Take 1 tablet (100 mg total) by mouth 2 (two) times daily for 7 days.   gabapentin  300 MG capsule Commonly known as: NEURONTIN  Take 1 capsule (300 mg total) by mouth at bedtime. What changed:  when to take this reasons to take this   ibuprofen  600 MG tablet Commonly known as: ADVIL  Take 1 tablet by mouth 3 times daily x 1 day; then after that take it every 8 hours as needed for pain/inflammation and swelling.   insulin  aspart protamine - aspart (70-30) 100 UNIT/ML FlexPen Commonly known as: NOVOLOG  70/30 MIX Inject 15 Units into the skin 2 (two) times daily.   Lancet Device Misc 1 each by Does not apply route as directed. Dispense based on patient and insurance preference. Use up to four times daily as directed. (FOR ICD-10 E10.9, E11.9).   Lancets Misc 1 each by Does not apply route as directed. Dispense based on patient and insurance preference. Use up to four times daily as directed. (FOR ICD-10 E10.9, E11.9).   metFORMIN  750 MG 24 hr tablet Commonly known as: GLUCOPHAGE -XR Take 1 tablet (750 mg total) by mouth in the morning and at bedtime. Replaces: metFORMIN  500 MG tablet   methocarbamol  500 MG tablet Commonly known as: ROBAXIN  Take 1 tablet (500 mg total) by mouth every 8 (eight) hours as needed for muscle spasms.   oxyCODONE  5 MG immediate  release tablet Commonly known as: Oxy IR/ROXICODONE  Take 2 tablets (10 mg total) by mouth every 6 (six) hours as needed for severe pain (pain score 7-10).   pantoprazole  40 MG tablet Commonly known as: PROTONIX  Take 1 tablet (40 mg total) by mouth 2 (two) times daily.   PEN NEEDLES 31GX5/16 31G X 8 MM Misc Used to inject insulin  twice a day as instructed.   rosuvastatin  10 MG tablet Commonly known as: Crestor  Take 1 tablet (10 mg total) by mouth daily.        Follow-up Information     Bacchus, Meade PEDLAR, FNP. Schedule an appointment as soon as possible for a visit in 10 day(s).   Specialty: Family Medicine Contact information: [address] [place] [address] [phone]                Discharge Exam: Andrew Ritter   04/26/24 1439  Weight: 118.8 kg   General exam: Alert, awake, oriented x 3; afebrile, reporting no nausea, no vomiting and feeling ready to go home. Respiratory system: Good saturation on room air. Cardiovascular system:RRR.  No rubs or gallops; no JVD. Gastrointestinal system: Abdomen is obese, nondistended, soft and nontender. No organomegaly or masses felt. Normal bowel sounds heard. Central nervous system: Moving 4  limbs spontaneously.  No focal neurological deficits. Extremities: No cyanosis or clubbing Skin: No petechiae.  Induration without fluctuation, mild tenderness tenderness and mildly serosanguineous discharge appreciated on the posterior aspect of his neck/upper back from cellulitic process.  (Overall improving) Psychiatry: Judgement and insight appear normal. Mood & affect appropriate.   Condition at discharge: Stable and improved.  The results of significant diagnostics from this hospitalization (including imaging, microbiology, ancillary and laboratory) are listed below for reference.   Imaging Studies: CT Soft Tissue Neck W Contrast Result Date: 04/26/2024 EXAM: CT NECK WITH CONTRAST 04/26/2024 05:02:29 PM TECHNIQUE: CT of  the neck was performed with the administration of 75 mL of iohexol  (OMNIPAQUE ) 300 MG/ML solution. Multiplanar reformatted images are provided for review. Automated exposure control, iterative reconstruction, and/or weight based adjustment of the mA/kV was utilized to reduce the radiation dose to as low as reasonably achievable. COMPARISON: None available. CLINICAL HISTORY: Soft tissue infection posterior neck. Possible insect bite to the posterior neck 2 days ago. Swelling. Left hand numbness. FINDINGS: AERODIGESTIVE TRACT: No discrete mass. No edema. SALIVARY GLANDS: The parotid and submandibular glands are unremarkable. THYROID: Unremarkable. LYMPH NODES: Borderline to mildly enlarged posterior left level III/V lymph nodes measuring 9 mm in short axis, likely reactive. SOFT TISSUES: Subcutaneous soft tissue swelling and inflammatory fat stranding throughout the posterior neck primarily left of midline. Inflammation extends around the left trapezius muscle and to the posterior cervical paraspinal muscles. No organized fluid collection or soft tissue gas. BRAIN, ORBITS, SINUSES AND MASTOIDS: Mild mucosal thickening in the maxillary sinuses. Clear mastoid air cells. No acute abnormality. LUNGS AND MEDIASTINUM: No acute abnormality. BONES: Carious left mandibular 1st molar tooth. No focal bone abnormality. IMPRESSION: 1. Soft tissue swelling/inflammation throughout the posterior neck primarily on the left without evidence of abscess. Electronically signed by: Dasie Hamburg MD 04/26/2024 06:32 PM EST RP Workstation: [id number]    Microbiology: Results for orders placed or performed during the hospital encounter of 04/26/24  Blood culture (routine x 2)     Status: None (Preliminary result)   Collection Time: 04/26/24  4:03 PM   Specimen: BLOOD  Result Value Ref Range Status   Specimen Description BLOOD BLOOD LEFT ARM  Final   Special Requests   Final    BOTTLES DRAWN AEROBIC AND ANAEROBIC Blood Culture results  may not be optimal due to an inadequate volume of blood received in culture bottles   Culture   Final    NO GROWTH 4 DAYS Performed at [facility], [address], [place], [place] [address]    Report Status PENDING  Incomplete  Blood culture (routine x 2)     Status: None (Preliminary result)   Collection Time: 04/26/24  4:25 PM   Specimen: BLOOD  Result Value Ref Range Status   Specimen Description BLOOD RIGHT ANTECUBITAL  Final   Special Requests   Final    BOTTLES DRAWN AEROBIC AND ANAEROBIC Blood Culture adequate volume   Culture   Final    NO GROWTH 4 DAYS Performed at [facility], [address], [place], [place] [address]    Report Status PENDING  Incomplete    Labs: CBC: Recent Labs  Lab 04/26/24 1603 04/27/24 0426 04/30/24 0430  WBC 11.2* 13.5* 7.4  NEUTROABS 7.0  --   --   HGB 17.6* 16.7 15.8  HCT 52.1* 49.7 47.1  MCV 84.7 85.7 85.2  PLT 175 173 203   Basic Metabolic Panel: Recent Labs  Lab 04/26/24 1603 04/27/24 0426 04/28/24  0444 04/30/24 0430  NA 131* 132* 133* 132*  K 4.9 3.8 3.4* 4.1  CL 95* 97* 98 100  CO2 26 22 25 25   GLUCOSE 418* 406* 293* 355*  BUN 7 9 8 9   CREATININE 1.18 1.00 0.88 0.97  CALCIUM  9.3 8.5* 8.6* 8.5*   Liver Function Tests: Recent Labs  Lab 04/26/24 1603  AST 17  ALT 21  ALKPHOS 137*  BILITOT 2.2*  PROT 8.0  ALBUMIN 4.4   CBG: Recent Labs  Lab 04/29/24 0704 04/29/24 1130 04/29/24 1658 04/29/24 1947 04/30/24 0722  GLUCAP 337* 303* 277* 292* 379*    Discharge time spent:  35 minutes.  Signed: Eric Nunnery, MD [facility] 04/30/2024

## 2024-05-01 ENCOUNTER — Telehealth: Payer: Self-pay

## 2024-05-01 LAB — CULTURE, BLOOD (ROUTINE X 2)
Culture: NO GROWTH
Culture: NO GROWTH
Special Requests: ADEQUATE

## 2024-05-01 NOTE — Transitions of Care (Post Inpatient/ED Visit) (Signed)
   05/01/2024  Name: Andrew Ritter MRN: [id number] DOB: 1990-11-28  Today's TOC FU Call Status: Today's TOC FU Call Status:: Unsuccessful Call (1st Attempt) Unsuccessful Call (1st Attempt) Date: 05/01/24  Attempted to reach the patient regarding the most recent Inpatient/ED visit.  Follow Up Plan: Additional outreach attempts will be made to reach the patient to complete the Transitions of Care (Post Inpatient/ED visit) call.    Bing Edison MSN, RN RN [facility]  [facility] Office Hours M-F [phone] Direct Dial: [phone] Main Phone [phone]  Fax: [phone] [email or web address]

## 2024-05-02 ENCOUNTER — Telehealth: Payer: Self-pay | Admitting: *Deleted

## 2024-05-02 NOTE — Transitions of Care (Post Inpatient/ED Visit) (Signed)
 05/02/2024  Name: Andrew Ritter MRN: [id number] DOB: [birth year]  Today's TOC FU Call Status: Today's TOC FU Call Status:: Successful TOC FU Call Completed TOC FU Call Complete Date: 05/02/24  Patient's Name and Date of Birth confirmed. Name, DOB  Transition Care Management Follow-up Telephone Call Date of Discharge: 04/30/24 [facility] Type of Discharge: Inpatient Admission Primary Inpatient Discharge Diagnosis:: cellulitus of neck How have you been since you were released from the hospital?: Same Any questions or concerns?: No (per patient his neck is still sore)  Items Reviewed: Did you receive and understand the discharge instructions provided?: Yes Medications obtained,verified, and reconciled?: Yes (Medications Reviewed) Any new allergies since your discharge?: No Dietary orders reviewed?: No Do you have support at home?: Yes People in Home [RPT]: spouse Name of Support/Comfort Primary Source: [place]  Medications Reviewed Today: Medications Reviewed Today     Reviewed by Kennieth Cathlean DEL, RN (Case Manager) on 05/02/24 at 1457  Med List Status: <None>   Medication Order Taking? Sig Documenting Provider Last Dose Status Informant  amLODipine  (NORVASC ) 5 MG tablet [id number] Yes Take 1 tablet (5 mg total) by mouth daily. Edman Meade PEDLAR, FNP  Active Self  aspirin  EC 81 MG tablet [id number] Yes Take 81 mg by mouth daily. Swallow whole. [provider]  Active Self  Blood Glucose Monitoring Suppl DEVI [id number] Yes 1 each by Does not apply route as directed. Dispense based on patient and insurance preference. Use up to four times daily as directed. (FOR ICD-10 E10.9, E11.9). Ricky Fines, MD  Active   cephALEXin  (KEFLEX ) 750 MG capsule [id number] Yes Take 1 capsule (750 mg total) by mouth 3 (three) times daily for 7 days. Ricky Fines, MD  Active   doxycycline  (VIBRA -TABS) 100 MG tablet [id number] Yes Take 1 tablet (100 mg total) by  mouth 2 (two) times daily for 7 days. Ricky Fines, MD  Active   gabapentin  (NEURONTIN ) 300 MG capsule [id number] Yes Take 1 capsule (300 mg total) by mouth at bedtime.  Patient taking differently: Take 300 mg by mouth at bedtime as needed (for sleep/tremors).   Edman Meade PEDLAR, FNP  Active Self  Glucose Blood (BLOOD GLUCOSE TEST STRIPS) STRP [id number] Yes 1 each by Does not apply route as directed. Dispense based on patient and insurance preference. Use up to four times daily as directed. (FOR ICD-10 E10.9, E11.9). Ricky Fines, MD  Active   ibuprofen  (ADVIL ) 600 MG tablet [id number] Yes Take 1 tablet by mouth 3 times daily x 1 day; then after that take it every 8 hours as needed for pain/inflammation and swelling. Ricky Fines, MD  Active   insulin  aspart protamine - aspart (NOVOLOG  70/30 MIX) (70-30) 100 UNIT/ML FlexPen [id number] Yes Inject 15 Units into the skin 2 (two) times daily. Ricky Fines, MD  Active   Insulin  Pen Needle (PEN NEEDLES 31GX5/16) 31G X 8 MM MISC [id number] Yes Used to inject insulin  twice a day as instructed. Ricky Fines, MD  Active   Lancet Device MISC [id number] Yes 1 each by Does not apply route as directed. Dispense based on patient and insurance preference. Use up to four times daily as directed. (FOR ICD-10 E10.9, E11.9). Ricky Fines, MD  Active   Lancets MISC [id number] Yes 1 each by Does not apply route as directed. Dispense based on patient and insurance preference. Use up to four times daily as directed. (FOR ICD-10 E10.9, E11.9). Ricky Fines, MD  Active   metFORMIN  (GLUCOPHAGE -XR) 750 MG  24 hr tablet [id number] Yes Take 1 tablet (750 mg total) by mouth in the morning and at bedtime. Ricky Fines, MD  Active   methocarbamol  (ROBAXIN ) 500 MG tablet [id number] Yes Take 1 tablet (500 mg total) by mouth every 8 (eight) hours as needed for muscle spasms. Ricky Fines, MD  Active   oxyCODONE  (OXY IR/ROXICODONE ) 5 MG immediate release tablet [id number]  Take 2  tablets (10 mg total) by mouth every 6 (six) hours as needed for severe pain (pain score 7-10).  Patient not taking: Reported on 05/02/2024   Ricky Fines, MD  Active   pantoprazole  (PROTONIX ) 40 MG tablet [id number] Yes Take 1 tablet (40 mg total) by mouth 2 (two) times daily. Cindie Carlin POUR, DO  Active   rosuvastatin  (CRESTOR ) 10 MG tablet [id number] Yes Take 1 tablet (10 mg total) by mouth daily. Edman Meade PEDLAR, FNP  Active Self            Home Care and Equipment/Supplies: Were Home Health Services Ordered?: NA Any new equipment or medical supplies ordered?: NA  Functional Questionnaire: Do you need assistance with bathing/showering or dressing?: No Do you need assistance with meal preparation?: No Do you need assistance with eating?: No Do you have difficulty maintaining continence: No Do you need assistance with getting out of bed/getting out of a chair/moving?: No Do you have difficulty managing or taking your medications?: No  Follow up appointments reviewed: PCP Follow-up appointment confirmed?: No MD Provider Line Number:[phone] Given: Yes (Patient will call for F/U appointment) Specialist Hospital Follow-up appointment confirmed?: NA Do you need transportation to your follow-up appointment?: No Do you understand care options if your condition(s) worsen?: Yes-patient verbalized understanding  SDOH Interventions Today    Flowsheet Row Most Recent Value  SDOH Interventions   Food Insecurity Interventions Intervention Not Indicated  [Per patient he is working with a social worker]  Housing Interventions Intervention Not Indicated  Transportation Interventions Intervention Not Indicated, Patient Resources (Friends/Family)  Utilities Interventions Intervention Not Indicated, Inpatient TOC  [patient working with social worker]   Discussed and offered 30 day TOC program.  Patient  declined.  The patient has been provided with contact information for the care  management team and has been advised to call with any health -related questions or concerns.  The patient verbalized understanding with current plan of care.  The patient is directed to their insurance card regarding availability of benefits coverage  Cathlean Headland BSN RN [facility] Health Care Management Coordinator [email or web address] Direct Dial: [phone]  Fax: [phone] Website: [email or web address]

## 2024-05-02 NOTE — Transitions of Care (Post Inpatient/ED Visit) (Signed)
° °  05/02/2024  Name: EMIR NACK MRN: [id number] DOB: 1991/01/23  Today's TOC FU Call Status: Today's TOC FU Call Status:: Unsuccessful Call (2nd Attempt) Unsuccessful Call (2nd Attempt) Date: 05/02/24  Attempted to reach the patient regarding the most recent Inpatient/ED visit.  Follow Up Plan: Additional outreach attempts will be made to reach the patient to complete the Transitions of Care (Post Inpatient/ED visit) call.   Cathlean Headland BSN RN [facility] Health Care Management Coordinator [email or web address] Direct Dial: [phone]  Fax: [phone] Website: [email or web address]
